# Patient Record
Sex: Female | Born: 1985 | Race: Black or African American | Hispanic: No | Marital: Single | Smoking: Former smoker
Health system: Southern US, Community
[De-identification: ages and names within clinical notes are randomized; demographics above are authoritative.]

## PROBLEM LIST (undated history)

## (undated) ENCOUNTER — Inpatient Hospital Stay (HOSPITAL_COMMUNITY): Payer: Self-pay

## (undated) DIAGNOSIS — K5900 Constipation, unspecified: Secondary | ICD-10-CM

## (undated) DIAGNOSIS — R1110 Vomiting, unspecified: Secondary | ICD-10-CM

## (undated) DIAGNOSIS — R51 Headache: Secondary | ICD-10-CM

## (undated) HISTORY — PX: INDUCED ABORTION: SHX677

## (undated) HISTORY — DX: Constipation, unspecified: K59.00

## (undated) HISTORY — DX: Vomiting, unspecified: R11.10

---
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
## 2003-09-09 ENCOUNTER — Inpatient Hospital Stay (HOSPITAL_COMMUNITY): Admission: AD | Admit: 2003-09-09 | Discharge: 2003-09-09 | Payer: Self-pay | Admitting: *Deleted

## 2003-09-25 ENCOUNTER — Inpatient Hospital Stay (HOSPITAL_COMMUNITY): Admission: AD | Admit: 2003-09-25 | Discharge: 2003-09-25 | Payer: Self-pay | Admitting: Obstetrics & Gynecology

## 2003-10-13 ENCOUNTER — Inpatient Hospital Stay (HOSPITAL_COMMUNITY): Admission: AD | Admit: 2003-10-13 | Discharge: 2003-10-13 | Payer: Self-pay | Admitting: Obstetrics and Gynecology

## 2003-11-11 ENCOUNTER — Ambulatory Visit (HOSPITAL_COMMUNITY): Admission: RE | Admit: 2003-11-11 | Discharge: 2003-11-11 | Payer: Self-pay | Admitting: *Deleted

## 2003-12-17 ENCOUNTER — Inpatient Hospital Stay (HOSPITAL_COMMUNITY): Admission: AD | Admit: 2003-12-17 | Discharge: 2003-12-17 | Payer: Self-pay | Admitting: Obstetrics and Gynecology

## 2004-01-23 ENCOUNTER — Ambulatory Visit (HOSPITAL_COMMUNITY): Admission: RE | Admit: 2004-01-23 | Discharge: 2004-01-23 | Payer: Self-pay | Admitting: *Deleted

## 2004-02-17 ENCOUNTER — Inpatient Hospital Stay (HOSPITAL_COMMUNITY): Admission: AD | Admit: 2004-02-17 | Discharge: 2004-02-17 | Payer: Self-pay | Admitting: *Deleted

## 2004-03-28 ENCOUNTER — Inpatient Hospital Stay (HOSPITAL_COMMUNITY): Admission: AD | Admit: 2004-03-28 | Discharge: 2004-03-28 | Payer: Self-pay | Admitting: *Deleted

## 2004-04-14 ENCOUNTER — Inpatient Hospital Stay (HOSPITAL_COMMUNITY): Admission: AD | Admit: 2004-04-14 | Discharge: 2004-04-18 | Payer: Self-pay | Admitting: Family Medicine

## 2004-04-15 ENCOUNTER — Encounter (INDEPENDENT_AMBULATORY_CARE_PROVIDER_SITE_OTHER): Payer: Self-pay | Admitting: Specialist

## 2004-11-11 ENCOUNTER — Emergency Department (HOSPITAL_COMMUNITY): Admission: EM | Admit: 2004-11-11 | Discharge: 2004-11-11 | Payer: Self-pay | Admitting: Emergency Medicine

## 2004-12-23 ENCOUNTER — Emergency Department (HOSPITAL_COMMUNITY): Admission: EM | Admit: 2004-12-23 | Discharge: 2004-12-23 | Payer: Self-pay | Admitting: Emergency Medicine

## 2006-01-16 ENCOUNTER — Emergency Department (HOSPITAL_COMMUNITY): Admission: EM | Admit: 2006-01-16 | Discharge: 2006-01-16 | Payer: Self-pay | Admitting: Emergency Medicine

## 2007-01-25 ENCOUNTER — Emergency Department (HOSPITAL_COMMUNITY): Admission: EM | Admit: 2007-01-25 | Discharge: 2007-01-25 | Payer: Self-pay | Admitting: Emergency Medicine

## 2010-01-07 ENCOUNTER — Emergency Department (HOSPITAL_COMMUNITY): Admission: EM | Admit: 2010-01-07 | Discharge: 2010-01-07 | Payer: Self-pay | Admitting: Emergency Medicine

## 2010-04-06 ENCOUNTER — Emergency Department (HOSPITAL_COMMUNITY): Admission: EM | Admit: 2010-04-06 | Discharge: 2010-04-06 | Payer: Self-pay | Admitting: Emergency Medicine

## 2010-04-27 ENCOUNTER — Emergency Department (HOSPITAL_COMMUNITY): Admission: EM | Admit: 2010-04-27 | Discharge: 2010-04-27 | Payer: Self-pay | Admitting: Emergency Medicine
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
## 2011-05-06 NOTE — Op Note (Signed)
NAME:  Kristen Phillips, Kristen Phillips                     ACCOUNT NO.:  [id number]   MEDICAL RECORD NO.:  [id number]                   PATIENT TYPE:  INP   LOCATION:  9103                                 FACILITY:  [facility]   PHYSICIAN:  Lesly Dukes, M.D.              DATE OF BIRTH:  [birth year]   DATE OF PROCEDURE:  04/15/2004  DATE OF DISCHARGE:                                 OPERATIVE REPORT   PREOPERATIVE DIAGNOSIS:  This is a 25 year old para 0-0-2-0 at term, with  nonreassuring fetal heart tracing, oligohydramnios, remote from delivery.   POSTOPERATIVE DIAGNOSIS:  This is a 25 year old para 0-0-2-0 at term, with  nonreassuring fetal heart tracing, oligohydramnios, remote from delivery.   PROCEDURE:  Primary low flap transverse cesarean section.   SURGEON:  Lesly Dukes, M.D.   ASSISTANTMichele Mcalpine D. Okey Dupre, M.D.   COMPLICATIONS:  None.   ANESTHESIA:  Epidural.   ESTIMATED BLOOD LOSS:  700 mL.   PATHOLOGY:  Placenta.   FINDINGS:  A viable female infant, Apgars 8 at one, 9 at five, vertex  presentation, with clear fluid.  Cord wrapped around the body x1.  Weight 4  pounds 1 ounce.  Cord pH 7.23.  Normal uterus, ovaries, and fallopian tubes.  Pediatrics at delivery.   PROCEDURE:  After informed consent was obtained, the patient was taken to  the operating room, where epidural anesthesia was found to be adequate.  The  patient was placed in the dorsal supine position.  A Foley was already in  place.  The patient was prepared and draped in normal sterile fashion.  FH  prior to skin incision was in the 40s but returned spontaneously after  approximately one month.  A dose of subcu terbutaline was given by the  anesthesiologist, and we proceeded with emergent C-section at this time.  Skin incision was made with a scalpel and carried down to the underlying  layer of fascia.  The fascia was incised in the midline and extended  bilaterally.  The superior and inferior aspects of the  fascial incision were  grasped with Kocher clamps, tented up, and dissected off sharply and bluntly  from the underlying layers of rectus muscle.  The rectus muscles were  separated in the midline.  The peritoneum was identified and tented up and  entered sharply with the Metzenbaum scissors.  This incision was extended  with good visualization of the bladder.  The bladder blade was inserted.  The vesicouterine peritoneum was identified, tented up, and entered sharply  with the Metzenbaum scissors.  The incision was extended bilaterally and the  bladder flap was created digitally.  The bladder blade was reinserted.  The  uterine incision was made in transverse fashion in the lower uterine segment  with a scalpel, extended bilaterally bluntly.  The baby's head delivered  without incident.  The nose and mouth were suctioned.  The baby's body  delivered easily.  There was  a cord wrapped around the body x1.  The cord  was clamped and cut and the baby was handed off to the awaiting  pediatrician.  Cord blood was sent for type and screen and cord gas was also  sent.  The placenta delivered spontaneously with three-vessel cord.  The  uterus was exteriorized and cleared of all clots and debris.  The uterine  incision was closed with 0 Vicryl  in a running locked fashion.  Good  hemostasis was noted.  The uterus was returned to the abdomen and noted to  be hemostatic off tension.  Irrigation was used intra-abdominally, good  hemostasis noted one last time.  The peritoneum and rectus muscles were also  hemostatic.  The fascia was closed with 0 Vicryl in a running fashion.  Good  hemostasis was noted.  The subcutaneous tissue was copiously irrigated and  noted to be hemostatic.  The skin was closed with staples.  A dressing was  placed on the abdomen.  The patient tolerated the procedure well.  The  sponge, lap, and needle count were correct x2, and the patient went to the  recovery room in stable  condition.                                               Lesly Dukes, M.D.    Lora Paula  D:  04/15/2004  T:  04/15/2004  Job:  [id number]

## 2011-06-02 ENCOUNTER — Emergency Department (HOSPITAL_COMMUNITY)
Admission: EM | Admit: 2011-06-02 | Discharge: 2011-06-03 | Disposition: A | Discharge status: Home / Self Care | Payer: Medicaid Other | Attending: Emergency Medicine | Admitting: Emergency Medicine

## 2011-06-02 DIAGNOSIS — R51 Headache: Secondary | ICD-10-CM | POA: Insufficient documentation

## 2011-06-11 ENCOUNTER — Emergency Department (HOSPITAL_COMMUNITY)
Admission: EM | Admit: 2011-06-11 | Discharge: 2011-06-11 | Disposition: A | Discharge status: Home / Self Care | Payer: Medicaid Other | Attending: Emergency Medicine | Admitting: Emergency Medicine

## 2011-06-11 ENCOUNTER — Emergency Department (HOSPITAL_COMMUNITY): Payer: Medicaid Other

## 2011-06-11 DIAGNOSIS — J069 Acute upper respiratory infection, unspecified: Secondary | ICD-10-CM | POA: Insufficient documentation

## 2011-06-11 DIAGNOSIS — R5381 Other malaise: Secondary | ICD-10-CM | POA: Insufficient documentation

## 2011-06-11 DIAGNOSIS — R110 Nausea: Secondary | ICD-10-CM | POA: Insufficient documentation

## 2011-06-11 DIAGNOSIS — R51 Headache: Secondary | ICD-10-CM | POA: Insufficient documentation

## 2011-06-11 DIAGNOSIS — B9789 Other viral agents as the cause of diseases classified elsewhere: Secondary | ICD-10-CM | POA: Insufficient documentation

## 2011-06-11 DIAGNOSIS — R5383 Other fatigue: Secondary | ICD-10-CM | POA: Insufficient documentation

## 2011-06-11 LAB — URINALYSIS, ROUTINE W REFLEX MICROSCOPIC
Bilirubin Urine: NEGATIVE
Ketones, ur: 15 mg/dL — AB
Leukocytes, UA: NEGATIVE
Nitrite: NEGATIVE
Protein, ur: NEGATIVE mg/dL
Urobilinogen, UA: 2 mg/dL — ABNORMAL HIGH (ref 0.0–1.0)

## 2011-06-11 LAB — POCT PREGNANCY, URINE: Preg Test, Ur: NEGATIVE

## 2013-07-31 ENCOUNTER — Encounter (HOSPITAL_COMMUNITY): Payer: Self-pay | Admitting: *Deleted

## 2013-07-31 ENCOUNTER — Inpatient Hospital Stay (HOSPITAL_COMMUNITY)
Admission: AD | Admit: 2013-07-31 | Discharge: 2013-07-31 | Disposition: A | Discharge status: Home / Self Care | Payer: Medicaid Other | Source: Ambulatory Visit | Attending: Obstetrics & Gynecology | Admitting: Obstetrics & Gynecology

## 2013-07-31 DIAGNOSIS — O210 Mild hyperemesis gravidarum: Secondary | ICD-10-CM | POA: Insufficient documentation

## 2013-07-31 DIAGNOSIS — R51 Headache: Secondary | ICD-10-CM | POA: Insufficient documentation

## 2013-07-31 DIAGNOSIS — O219 Vomiting of pregnancy, unspecified: Secondary | ICD-10-CM

## 2013-07-31 HISTORY — DX: Headache: R51

## 2013-07-31 LAB — URINALYSIS, ROUTINE W REFLEX MICROSCOPIC
Ketones, ur: >80 mg/dL — AB
Leukocytes, UA: NEGATIVE
Nitrite: NEGATIVE
Protein, ur: 30 mg/dL — AB
Urobilinogen, UA: 0.2 mg/dL (ref 0.0–1.0)

## 2013-07-31 LAB — POCT PREGNANCY, URINE: Preg Test, Ur: POSITIVE — AB

## 2013-07-31 MED ORDER — DEXTROSE 5 % IN LACTATED RINGERS IV BOLUS
1000.0000 mL | Freq: Once | INTRAVENOUS | Status: AC
  Administered 2013-07-31: 1000 mL via INTRAVENOUS

## 2013-07-31 MED ORDER — HYDROMORPHONE HCL PF 1 MG/ML IJ SOLN
1.0000 mg | Freq: Once | INTRAMUSCULAR | Status: AC
  Administered 2013-07-31: 1 mg via INTRAMUSCULAR
  Filled 2013-07-31: qty 1

## 2013-07-31 MED ORDER — PROMETHAZINE HCL 25 MG/ML IJ SOLN
25.0000 mg | Freq: Once | INTRAVENOUS | Status: AC
  Administered 2013-07-31: 25 mg via INTRAVENOUS
  Filled 2013-07-31: qty 1

## 2013-07-31 MED ORDER — ONDANSETRON 8 MG PO TBDP
8.0000 mg | ORAL_TABLET | Freq: Once | ORAL | Status: AC
  Administered 2013-07-31: 8 mg via ORAL
  Filled 2013-07-31: qty 1

## 2013-07-31 MED ORDER — PROMETHAZINE HCL 25 MG RE SUPP: 25.0000 mg | Freq: Four times a day (QID) | RECTAL | Status: DC | PRN

## 2013-07-31 NOTE — MAU Note (Signed)
Patient states she has had a positive pregnancy test at [facility]. Has had nausea for a while, but has not been able to eat or keep anything down for the past 2 days. Has had a headache for about 5-6 days. Denies abdominal pain or bleeding.

## 2013-07-31 NOTE — MAU Provider Note (Signed)
History     CSN: [id number]  Arrival date and time: 07/31/13 1021   First Provider Initiated Contact with Patient 07/31/13 1106      Chief Complaint  Patient presents with  . Possible Pregnancy  . Emesis   HPI Ms. Cellie A Manrique is a 27 y.o. [id number] at [redacted]w[redacted]d who presents to MAU today with complaint of N/V and headache. [facility] She has been having N/V since then. States that she hasn't been able to keep anything down x 3 days. She has headache today rated at 10/10. She has not taken anything for pain. She denies abdominal pain, vaginal bleeding, discharge, UTI symptoms, fever, diarrhea or constipation.   OB History   Grav Para Term Preterm Abortions TAB SAB Ect Mult Living   5 1 1  3 3    1       Past Medical History  Diagnosis Date  . ZHYQMVHQ(469.6)     Past Surgical History  Procedure Laterality Date  . Cesarean section    . Induced abortion      Family History  Problem Relation Age of Onset  . Diabetes Mother   . Diabetes Maternal Grandmother   . Diabetes Paternal Grandmother     History  Substance Use Topics  . Smoking status: Never Smoker   . Smokeless tobacco: Not on file  . Alcohol Use: No    Allergies: Allergies not on file  No prescriptions prior to admission    Review of Systems  Constitutional: Negative for fever and malaise/fatigue.  Gastrointestinal: Positive for nausea and vomiting. Negative for abdominal pain, diarrhea and constipation.  Genitourinary: Negative for dysuria, urgency and frequency.       Neg - vaginal bleeding, discharge  Neurological: Positive for dizziness and weakness. Negative for loss of consciousness.   Physical Exam   Blood pressure 117/78, pulse 92, temperature 98.9 F (37.2 C), temperature source Oral, resp. rate 18, height 5\' 2"  (1.575 m), weight 139 lb 6.4 oz (63.231 kg), last menstrual period 06/15/2013, SpO2 100.00%.  Physical Exam  Constitutional: She is  oriented to person, place, and time. She appears well-developed and well-nourished. No distress.  HENT:  Head: Normocephalic and atraumatic.  Cardiovascular: Normal rate and regular rhythm.   Respiratory: Effort normal and breath sounds normal. No respiratory distress.  GI: Soft. Bowel sounds are normal. She exhibits no distension and no mass. There is no tenderness. There is no rebound and no guarding.  Neurological: She is alert and oriented to person, place, and time.  Skin: Skin is warm and dry. No erythema.  Psychiatric: She has a normal mood and affect.   Results for orders placed during the hospital encounter of 07/31/13 (from the past 24 hour(s))  POCT PREGNANCY, URINE     Status: Abnormal   Collection Time    07/31/13 11:04 AM      Result Value Range   Preg Test, Ur POSITIVE (*) NEGATIVE     MAU Course  Procedures None  MDM UPT - positive UA today 1 liter Phenergan infused IV LR given 1 mg Dilaudid for headache 1240 - Patient reports significant improvement in symptoms. Will try PO intake and 1 additional liter of D5LR for adequate hydration 1320 - Care turned over to Jeani Sow, NP  Freddi Starr, PA-C  07/31/2013, 11:06 AM  Assessment and Plan  MDM continued: 13.35  Patient is feeling better.  Headache has resolved.  She is ready to  try crackers and sprite.  Patient was unable to keep sprite and crackers down.  Zofran 8mg  ODT given 14:51  Patient is ready for discharge.  Discussed antiemetics with the patient and would like Rx for Phenergan Supp.   She is driving and states she feels fine to drive.  Denies sleepiness/drowsiness from meds given in MAU.     A:  Nausea and vomiting at [redacted]w[redacted]d gestation  P:  Rx for Phenergan Supp to pharmacy      Diet instructions given      Patient has list of MDs to begin prenatal care

## 2013-07-31 NOTE — MAU Note (Signed)
C/o N&V for a week-- last 2 days have been the worse;

## 2013-08-01 LAB — URINE CULTURE: Culture: NO GROWTH

## 2013-08-01 NOTE — MAU Provider Note (Signed)

## 2013-08-10 ENCOUNTER — Inpatient Hospital Stay (HOSPITAL_COMMUNITY)
Admission: AD | Admit: 2013-08-10 | Discharge: 2013-08-10 | Disposition: A | Discharge status: Home / Self Care | Payer: Medicaid Other | Source: Ambulatory Visit | Attending: Obstetrics & Gynecology | Admitting: Obstetrics & Gynecology

## 2013-08-10 ENCOUNTER — Encounter (HOSPITAL_COMMUNITY): Payer: Self-pay | Admitting: *Deleted

## 2013-08-10 DIAGNOSIS — O219 Vomiting of pregnancy, unspecified: Secondary | ICD-10-CM

## 2013-08-10 DIAGNOSIS — O210 Mild hyperemesis gravidarum: Secondary | ICD-10-CM | POA: Insufficient documentation

## 2013-08-10 LAB — URINALYSIS, ROUTINE W REFLEX MICROSCOPIC
Glucose, UA: NEGATIVE mg/dL
Leukocytes, UA: NEGATIVE
Urobilinogen, UA: 1 mg/dL (ref 0.0–1.0)
pH: 7 (ref 5.0–8.0)

## 2013-08-10 LAB — URINE MICROSCOPIC-ADD ON

## 2013-08-10 MED ORDER — PROMETHAZINE HCL 25 MG RE SUPP: 25.0000 mg | Freq: Four times a day (QID) | RECTAL | Status: DC | PRN

## 2013-08-10 MED ORDER — ONDANSETRON 8 MG/NS 50 ML IVPB
8.0000 mg | Freq: Once | INTRAVENOUS | Status: AC
  Administered 2013-08-10: 8 mg via INTRAVENOUS
  Filled 2013-08-10: qty 8

## 2013-08-10 MED ORDER — SODIUM CHLORIDE 0.9 % IV SOLN
Freq: Once | INTRAVENOUS | Status: AC
  Administered 2013-08-10: 18:00:00 via INTRAVENOUS
  Filled 2013-08-10: qty 1000

## 2013-08-10 NOTE — MAU Provider Note (Signed)
  CSN: [id number]     Arrival date & time 08/10/13  1654 History     None    Chief Complaint  Patient presents with  . Emesis During Pregnancy   (Consider location/radiation/quality/duration/timing/severity/associated sxs/prior Treatment) HPI Kristen Phillips is a 27 y.o. G5P1031 at [redacted]w[redacted]d. She presents for re occurrence of nausea and vomiting. She ran out of her Phenergan supp, the had stopped working about 2 d previous to that. She is vomiting every hr, hasn't kept down a meal in 1-2 days. No bleeding or cramping. Has applied for Medicaid.  Past Medical History  Diagnosis Date  . [id number](478.2)    Past Surgical History  Procedure Laterality Date  . Cesarean section    . Induced abortion     Family History  Problem Relation Age of Onset  . Diabetes Mother   . Diabetes Maternal Grandmother   . Diabetes Paternal Grandmother    History  Substance Use Topics  . Smoking status: Never Smoker   . Smokeless tobacco: Not on file  . Alcohol Use: No   OB History   Grav Para Term Preterm Abortions TAB SAB Ect Mult Living   5 1 1  3 3    1      Review of Systems  Constitutional: Positive for appetite change and fatigue. Negative for fever and chills.  Gastrointestinal: Positive for nausea and vomiting. Negative for abdominal pain, diarrhea and constipation.  Genitourinary: Negative for dysuria, urgency, frequency, vaginal bleeding and vaginal discharge.    Allergies  Review of patient's allergies indicates no known allergies.  Home Medications  No current outpatient prescriptions on file. BP 114/79  Pulse 100  Temp(Src) 98.6 F (37 C) (Oral)  Resp 18  Ht 5' 1.5" (1.562 m)  Wt 141 lb (63.957 kg)  BMI 26.21 kg/m2  LMP 06/15/2013 Physical Exam  Constitutional: She is oriented to person, place, and time. She appears well-developed and well-nourished.  Abdominal: Soft. There is no tenderness.  Musculoskeletal: Normal range of motion.  Neurological: She is alert and  oriented to person, place, and time.  Skin: Skin is warm and dry.  Psychiatric: She has a normal mood and affect. Her behavior is normal.    ED Course   Procedures (including critical care time)  Labs Reviewed  URINALYSIS, ROUTINE W REFLEX MICROSCOPIC - Abnormal; Notable for the following:    Ketones, ur 40 (*)    Protein, ur 30 (*)    All other components within normal limits  URINE MICROSCOPIC-ADD ON - Abnormal; Notable for the following:    Squamous Epithelial / LPF FEW (*)    All other components within normal limits      MDM  ASSESSMENT: 8 wk with nausea and vomiting in pregnancy Kept crackers and fluid down after Zofran and 1 liter fluids Rx Pheneran 25 mg supp for home use Behaviors reviewed for N&V management Make an appt for [facility] when gets benefits

## 2013-08-10 NOTE — MAU Note (Signed)
Can't stop vomiting, the medicine they gave her is not working, phenergan supp, last used this morning.

## 2013-08-10 NOTE — MAU Note (Signed)
Pt reports n/v for 2-3 days. Pt reports the her medicine was working up until a couple of days ago and now she can not keep any food down.

## 2013-08-12 ENCOUNTER — Encounter (HOSPITAL_COMMUNITY): Payer: Self-pay | Admitting: *Deleted

## 2013-08-12 ENCOUNTER — Inpatient Hospital Stay (HOSPITAL_COMMUNITY)
Admission: AD | Admit: 2013-08-12 | Discharge: 2013-08-12 | Disposition: A | Discharge status: Home / Self Care | Payer: Medicaid Other | Source: Ambulatory Visit | Attending: Obstetrics & Gynecology | Admitting: Obstetrics & Gynecology

## 2013-08-12 DIAGNOSIS — O219 Vomiting of pregnancy, unspecified: Secondary | ICD-10-CM

## 2013-08-12 DIAGNOSIS — O210 Mild hyperemesis gravidarum: Secondary | ICD-10-CM

## 2013-08-12 LAB — URINALYSIS, ROUTINE W REFLEX MICROSCOPIC
Ketones, ur: >80 mg/dL — AB
Leukocytes, UA: NEGATIVE
Nitrite: NEGATIVE
Specific Gravity, Urine: >1.03 — ABNORMAL HIGH (ref 1.005–1.030)
Urobilinogen, UA: 1 mg/dL (ref 0.0–1.0)
pH: 6.5 (ref 5.0–8.0)

## 2013-08-12 LAB — COMPREHENSIVE METABOLIC PANEL
ALT: 55 U/L — ABNORMAL HIGH (ref 0–35)
AST: 29 U/L (ref 0–37)
Albumin: 3.9 g/dL (ref 3.5–5.2)
BUN: 8 mg/dL (ref 6–23)
CO2: 17 mEq/L — ABNORMAL LOW (ref 19–32)
Calcium: 9.4 mg/dL (ref 8.4–10.5)
GFR calc non Af Amer: >90 mL/min (ref >90)
Sodium: 136 mEq/L (ref 135–145)

## 2013-08-12 MED ORDER — RANITIDINE HCL 150 MG PO TABS: 150.0000 mg | ORAL_TABLET | Freq: Two times a day (BID) | ORAL | Status: DC

## 2013-08-12 MED ORDER — METOCLOPRAMIDE HCL 10 MG PO TABS
10.0000 mg | ORAL_TABLET | Freq: Once | ORAL | Status: AC
  Administered 2013-08-12: 10 mg via ORAL
  Filled 2013-08-12: qty 1

## 2013-08-12 MED ORDER — ONDANSETRON 8 MG/NS 50 ML IVPB
8.0000 mg | Freq: Once | INTRAVENOUS | Status: AC
  Administered 2013-08-12: 8 mg via INTRAVENOUS
  Filled 2013-08-12: qty 8

## 2013-08-12 MED ORDER — DEXTROSE 5 % IN LACTATED RINGERS IV BOLUS
1000.0000 mL | Freq: Once | INTRAVENOUS | Status: AC
  Administered 2013-08-12: 1000 mL via INTRAVENOUS

## 2013-08-12 MED ORDER — METOCLOPRAMIDE HCL 10 MG PO TABS: 10.0000 mg | ORAL_TABLET | Freq: Four times a day (QID) | ORAL | Status: DC

## 2013-08-12 MED ORDER — LACTATED RINGERS IV BOLUS (SEPSIS)
1000.0000 mL | Freq: Once | INTRAVENOUS | Status: AC
  Administered 2013-08-12: 1000 mL via INTRAVENOUS

## 2013-08-12 NOTE — MAU Note (Signed)
Pt states here for vomiting. Has vomited x10 today. Is constantly nauseated. Last attempted food on Saturday. Denies abnormal vaginal discharge or bleeding. No pain at present.

## 2013-08-12 NOTE — MAU Provider Note (Signed)
Attestation of Attending Supervision of Advanced Practitioner (CNM/NP): Evaluation and management procedures were performed by the Advanced Practitioner under my supervision and collaboration.  I have reviewed the Advanced Practitioner's note and chart, and I agree with the management and plan.  HARRAWAY-SMITH, Abdoulaye Drum 10:28 PM     

## 2013-08-12 NOTE — MAU Provider Note (Signed)
History     CSN: [id number]  Arrival date and time: 08/12/13 5284   First Provider Initiated Contact with Patient 08/12/13 1900      Chief Complaint  Patient presents with  . Hyperemesis Gravidarum   HPI  Kristen Phillips is a 27 y.o. G5P1031 at [redacted]w[redacted]d who presents today with nausea and vomiting. This is her 3rd visit for this complaint. She was most recently here on 08/10/13, and has lost 3lbs since that visit. She has been using phenergan suppositories at home, and they are not helping. She states that it helped for about a week. She has no other medicines at home. She has not started [facility] and does not have an appointment at this time.   Past Medical History  Diagnosis Date  . XLKGMWNU(272.5)     Past Surgical History  Procedure Laterality Date  . Cesarean section    . Induced abortion      Family History  Problem Relation Age of Onset  . Diabetes Mother   . Diabetes Maternal Grandmother   . Diabetes Paternal Grandmother     History  Substance Use Topics  . Smoking status: Former Smoker -- 0.25 packs/day for 12 years    Types: Cigarettes    Quit date: 07/13/2013  . Smokeless tobacco: Not on file  . Alcohol Use: No    Allergies: No Known Allergies  Prescriptions prior to admission  Medication Sig Dispense Refill  . promethazine (PHENERGAN) 25 MG suppository Place 1 suppository (25 mg total) rectally every 6 (six) hours as needed for nausea.  12 each  0  . promethazine (PROMETHEGAN) 25 MG suppository Place 1 suppository (25 mg total) rectally every 6 (six) hours as needed for nausea.  30 each  0    ROS Physical Exam   Blood pressure 126/81, pulse 86, temperature 100 F (37.8 C), temperature source Oral, resp. rate 16, height 5\' 2"  (1.575 m), weight 63.05 kg (139 lb), last menstrual period 06/15/2013, SpO2 100.00%.  Physical Exam  Nursing note and vitals reviewed. Constitutional: She is oriented to person, place, and time. She appears well-developed and  well-nourished. No distress.  Cardiovascular: Normal rate.   Respiratory: Effort normal.  GI: Soft.  Vomiting while in room.   Neurological: She is alert and oriented to person, place, and time.  Skin: Skin is warm and dry.  Psychiatric: She has a normal mood and affect.    MAU Course  Procedures  Results for orders placed during the hospital encounter of 08/12/13 (from the past 24 hour(s))  URINALYSIS, ROUTINE W REFLEX MICROSCOPIC     Status: Abnormal   Collection Time    08/12/13  6:27 PM      Result Value Range   Color, Urine AMBER (*) YELLOW   APPearance HAZY (*) CLEAR   Specific Gravity, Urine >1.030 (*) 1.005 - 1.030   pH 6.5  5.0 - 8.0   Glucose, UA NEGATIVE  NEGATIVE mg/dL   Hgb urine dipstick NEGATIVE  NEGATIVE   Bilirubin Urine SMALL (*) NEGATIVE   Ketones, ur >80 (*) NEGATIVE mg/dL   Protein, ur NEGATIVE  NEGATIVE mg/dL   Urobilinogen, UA 1.0  0.0 - 1.0 mg/dL   Nitrite NEGATIVE  NEGATIVE   Leukocytes, UA NEGATIVE  NEGATIVE  COMPREHENSIVE METABOLIC PANEL     Status: Abnormal   Collection Time    08/12/13  7:05 PM      Result Value Range   Sodium 136  135 - 145 mEq/L  Potassium 3.5  3.5 - 5.1 mEq/L   Chloride 102  96 - 112 mEq/L   CO2 17 (*) 19 - 32 mEq/L   Glucose, Bld 117 (*) 70 - 99 mg/dL   BUN 8  6 - 23 mg/dL   Creatinine, Ser 1.61  0.50 - 1.10 mg/dL   Calcium 9.4  8.4 - 09.6 mg/dL   Total Protein 6.9  6.0 - 8.3 g/dL   Albumin 3.9  3.5 - 5.2 g/dL   AST 29  0 - 37 U/L   ALT 55 (*) 0 - 35 U/L   Alkaline Phosphatase 67  39 - 117 U/L   Total Bilirubin 0.7  0.3 - 1.2 mg/dL   GFR calc non Af Amer >90  >90 mL/min   GFR calc Af Amer >90  >90 mL/min    2145: Patient is feeling much better at this time.   Assessment and Plan   1. Nausea and vomiting in pregnancy prior to [redacted] weeks gestation    Start [facility] as soon as possible 1st trimester precautions RX: reglan 10mg  QID Zantac 150mg  BID Vitamin B6 100mg  QD Phenergan PRN  Return to MAU as needed    Tawnya Crook 08/12/2013, 7:01 PM

## 2013-08-12 NOTE — MAU Note (Signed)
Pt been vomiting for the last 3 weeks. Given a phenergan suppository which helped for a couple of hours.

## 2013-08-27 ENCOUNTER — Encounter (HOSPITAL_COMMUNITY): Payer: Self-pay | Admitting: *Deleted

## 2013-08-27 ENCOUNTER — Inpatient Hospital Stay (HOSPITAL_COMMUNITY)
Admission: AD | Admit: 2013-08-27 | Discharge: 2013-08-27 | Disposition: A | Discharge status: Home / Self Care | Payer: Medicaid Other | Source: Ambulatory Visit | Attending: Obstetrics and Gynecology | Admitting: Obstetrics and Gynecology

## 2013-08-27 DIAGNOSIS — O219 Vomiting of pregnancy, unspecified: Secondary | ICD-10-CM

## 2013-08-27 DIAGNOSIS — O210 Mild hyperemesis gravidarum: Secondary | ICD-10-CM | POA: Insufficient documentation

## 2013-08-27 LAB — URINALYSIS, ROUTINE W REFLEX MICROSCOPIC
Leukocytes, UA: NEGATIVE
Specific Gravity, Urine: >1.03 — ABNORMAL HIGH (ref 1.005–1.030)
Urobilinogen, UA: >8 mg/dL — ABNORMAL HIGH (ref 0.0–1.0)
pH: 6 (ref 5.0–8.0)

## 2013-08-27 LAB — URINE MICROSCOPIC-ADD ON

## 2013-08-27 LAB — COMPREHENSIVE METABOLIC PANEL
ALT: 33 U/L (ref 0–35)
Albumin: 4.2 g/dL (ref 3.5–5.2)
Alkaline Phosphatase: 71 U/L (ref 39–117)
BUN: 13 mg/dL (ref 6–23)
Chloride: 101 mEq/L (ref 96–112)
GFR calc Af Amer: >90 mL/min (ref >90)
Glucose, Bld: 91 mg/dL (ref 70–99)
Potassium: 3.9 mEq/L (ref 3.5–5.1)
Sodium: 135 mEq/L (ref 135–145)
Total Bilirubin: 1.3 mg/dL — ABNORMAL HIGH (ref 0.3–1.2)
Total Protein: 7.8 g/dL (ref 6.0–8.3)

## 2013-08-27 MED ORDER — ONDANSETRON HCL 4 MG PO TABS: 4.0000 mg | ORAL_TABLET | Freq: Four times a day (QID) | ORAL | Status: DC

## 2013-08-27 MED ORDER — ONDANSETRON 8 MG/NS 50 ML IVPB
8.0000 mg | Freq: Once | INTRAVENOUS | Status: AC
  Administered 2013-08-27: 8 mg via INTRAVENOUS
  Filled 2013-08-27: qty 8

## 2013-08-27 MED ORDER — PROMETHAZINE HCL 25 MG RE SUPP: 25.0000 mg | Freq: Four times a day (QID) | RECTAL | Status: DC | PRN

## 2013-08-27 MED ORDER — PROMETHAZINE HCL 25 MG/ML IJ SOLN
25.0000 mg | Freq: Once | INTRAVENOUS | Status: AC
  Administered 2013-08-27: 25 mg via INTRAVENOUS
  Filled 2013-08-27: qty 1

## 2013-08-27 MED ORDER — LACTATED RINGERS IV SOLN
Freq: Once | INTRAVENOUS | Status: AC
  Administered 2013-08-27: 14:00:00 via INTRAVENOUS

## 2013-08-27 MED ORDER — FAMOTIDINE IN NACL 20-0.9 MG/50ML-% IV SOLN
20.0000 mg | Freq: Once | INTRAVENOUS | Status: AC
  Administered 2013-08-27: 20 mg via INTRAVENOUS
  Filled 2013-08-27: qty 50

## 2013-08-27 NOTE — MAU Provider Note (Signed)
History     CSN: [id number]  Arrival date and time: 08/27/13 1252   First Provider Initiated Contact with Patient 08/27/13 1346      Chief Complaint  Patient presents with  . Emesis During Pregnancy   HPI  [name] is a 27 y.o. female; [phone] at [redacted]w[redacted]d who presents with Nausea/Vomiting. She is unable to keep anything down; she has had nothing to eat or drink today. She is unsure where she plans to go for prenatal care; she is still waiting on her medicaid.  She has phenergan suppository's at home; yesterday she took two doses and attempted to eat toast and crackers in which she was unable to keep down. For 3 days she was feeling better and stopped taking her medication, however the symptoms have returned. She has lost 4 lbs since she became pregnant.   OB History   Grav Para Term Preterm Abortions TAB SAB Ect Mult Living   5 1 1  3 3    1       Past Medical History  Diagnosis Date  . ZHYQMVHQ(469.6)     Past Surgical History  Procedure Laterality Date  . Cesarean section    . Induced abortion      Family History  Problem Relation Age of Onset  . Diabetes Mother   . Diabetes Maternal Grandmother   . Diabetes Paternal Grandmother     History  Substance Use Topics  . Smoking status: Former Smoker -- 0.25 packs/day for 12 years    Types: Cigarettes    Quit date: 07/13/2013  . Smokeless tobacco: Not on file  . Alcohol Use: No    Allergies: No Known Allergies  Prescriptions prior to admission  Medication Sig Dispense Refill  . metoCLOPramide (REGLAN) 10 MG tablet Take 1 tablet (10 mg total) by mouth 4 (four) times daily.  180 tablet  3  . promethazine (PHENERGAN) 25 MG suppository Place 1 suppository (25 mg total) rectally every 6 (six) hours as needed for nausea.  12 each  0  . Pyridoxine HCl (VITAMIN B-6 PO) Take 1 tablet by mouth daily.      . ranitidine (ZANTAC) 150 MG tablet Take 1 tablet (150 mg total) by mouth 2 (two) times daily.  60 tablet  3    Results for orders placed during the hospital encounter of 08/27/13 (from the past 24 hour(s))  URINALYSIS, ROUTINE W REFLEX MICROSCOPIC     Status: Abnormal   Collection Time    08/27/13  1:10 PM      Result Value Range   Color, Urine AMBER (*) YELLOW   APPearance CLOUDY (*) CLEAR   Specific Gravity, Urine >1.030 (*) 1.005 - 1.030   pH 6.0  5.0 - 8.0   Glucose, UA 100 (*) NEGATIVE mg/dL   Hgb urine dipstick NEGATIVE  NEGATIVE   Bilirubin Urine MODERATE (*) NEGATIVE   Ketones, ur >80 (*) NEGATIVE mg/dL   Protein, ur 30 (*) NEGATIVE mg/dL   Urobilinogen, UA >2.9 (*) 0.0 - 1.0 mg/dL   Nitrite NEGATIVE  NEGATIVE   Leukocytes, UA NEGATIVE  NEGATIVE  URINE MICROSCOPIC-ADD ON     Status: Abnormal   Collection Time    08/27/13  1:10 PM      Result Value Range   Squamous Epithelial / LPF MANY (*) RARE   WBC, UA 3-6  <3 WBC/hpf   RBC / HPF 3-6  <3 RBC/hpf   Bacteria, UA MANY (*) RARE   Urine-Other MUCOUS  PRESENT    COMPREHENSIVE METABOLIC PANEL     Status: Abnormal   Collection Time    08/27/13  2:04 PM      Result Value Range   Sodium 135  135 - 145 mEq/L   Potassium 3.9  3.5 - 5.1 mEq/L   Chloride 101  96 - 112 mEq/L   CO2 19  19 - 32 mEq/L   Glucose, Bld 91  70 - 99 mg/dL   BUN 13  6 - 23 mg/dL   Creatinine, Ser 1.61  0.50 - 1.10 mg/dL   Calcium 09.6  8.4 - 04.5 mg/dL   Total Protein 7.8  6.0 - 8.3 g/dL   Albumin 4.2  3.5 - 5.2 g/dL   AST 32  0 - 37 U/L   ALT 33  0 - 35 U/L   Alkaline Phosphatase 71  39 - 117 U/L   Total Bilirubin 1.3 (*) 0.3 - 1.2 mg/dL   GFR calc non Af Amer >90  >90 mL/min   GFR calc Af Amer >90  >90 mL/min    Review of Systems  Constitutional: Negative for fever and chills.  Gastrointestinal: Positive for nausea and vomiting. Negative for abdominal pain, diarrhea and constipation.       Vomiting 10-15 times per day  Genitourinary: Negative for dysuria, urgency, frequency and hematuria.       No vaginal discharge. No vaginal bleeding. No  dysuria.   Neurological: Positive for dizziness and headaches.   Physical Exam   Blood pressure 119/80, temperature 98.3 F (36.8 C), temperature source Oral, resp. rate 16, weight 61.145 kg (134 lb 12.8 oz), last menstrual period 06/15/2013, SpO2 100.00%. Fetal heart tones by doppler 157 bpm    Physical Exam  Constitutional: She is oriented to person, place, and time. She appears well-developed and well-nourished. No distress.  HENT:  Head: Normocephalic.  Eyes: Pupils are equal, round, and reactive to light.  Neck: Neck supple.  Cardiovascular: Normal rate.   Respiratory: Effort normal.  GI: Soft. There is no tenderness.  Neurological: She is alert and oriented to person, place, and time.  Skin: Skin is warm and dry. She is not diaphoretic.  Psychiatric: She exhibits a depressed mood.    MAU Course  Procedures  MDM UA CBC +fht Phenergan bolus 1 L LR bolus 1 L  Weight on 08/12/13: 139 lbs Weight on 08/27/13: 134 lbs  Patient has a HA 6/10 Vomited after trial of oral fluids Zofran 8 mg IV piggy back  Pepcid 20 mg IVP  Pt tolerate PO fluids; refused to attempt PO solids/crackers Pt talking on the cell phone; awake and alert at discharge   Assessment and Plan  A: Nausea and vomiting in pregnancy   P: Discharge home RX: Phenergan suppository         Zofran 4 mg PO Q8 hours PRN  Return to MAU if symptoms worsen Take your medication; wait 30 minutes and eat something bland  BRAT diet discussed Referral to the clinic made   [facility], Quiana Cobaugh IRENE FNP-C 08/27/2013, 8:32 PM

## 2013-08-27 NOTE — MAU Note (Addendum)
Patient c/o of nausea and vomiting since Friday. Patient reports she vomited 10 times in the past 24 hours. Patient reports not being able to tolerate fluids or food since yesterday. Patient took anti-emetic yesterday but it did not work. Patient denies vaginal bleeding and lof. Patient also reports some dizziness.

## 2013-08-27 NOTE — MAU Note (Signed)
Patient states she has not been able to keep anything down since 9-5 and has had headache for 2 days. Denies bleeding or discharge.

## 2013-08-28 LAB — URINE CULTURE: Colony Count: 60000

## 2013-08-28 NOTE — MAU Provider Note (Signed)
Attestation of Attending Supervision of Advanced Practitioner (CNM/NP): Evaluation and management procedures were performed by the Advanced Practitioner under my supervision and collaboration.  I have reviewed the Advanced Practitioner's note and chart, and I agree with the management and plan.  Ashyla Luth 08/28/2013 8:43 AM

## 2013-09-07 ENCOUNTER — Inpatient Hospital Stay (HOSPITAL_COMMUNITY)
Admission: AD | Admit: 2013-09-07 | Discharge: 2013-09-07 | Disposition: A | Discharge status: Home / Self Care | Payer: Medicaid Other | Source: Ambulatory Visit | Attending: Obstetrics & Gynecology | Admitting: Obstetrics & Gynecology

## 2013-09-07 ENCOUNTER — Encounter (HOSPITAL_COMMUNITY): Payer: Self-pay | Admitting: *Deleted

## 2013-09-07 DIAGNOSIS — O210 Mild hyperemesis gravidarum: Secondary | ICD-10-CM | POA: Insufficient documentation

## 2013-09-07 LAB — CBC WITH DIFFERENTIAL/PLATELET
Basophils Relative: 0 % (ref 0–1)
Eosinophils Absolute: 0.1 10*3/uL (ref 0.0–0.7)
Eosinophils Relative: 1 % (ref 0–5)
HCT: 37.1 % (ref 36.0–46.0)
Hemoglobin: 12.7 g/dL (ref 12.0–15.0)
MCH: 32.3 pg (ref 26.0–34.0)
MCHC: 34.2 g/dL (ref 30.0–36.0)
MCV: 94.4 fL (ref 78.0–100.0)
Monocytes Absolute: 0.8 10*3/uL (ref 0.1–1.0)
Monocytes Relative: 7 % (ref 3–12)
RDW: 12.6 % (ref 11.5–15.5)

## 2013-09-07 LAB — URINE MICROSCOPIC-ADD ON: RBC / HPF: NONE SEEN RBC/hpf (ref <3)

## 2013-09-07 LAB — COMPREHENSIVE METABOLIC PANEL
Albumin: 3.7 g/dL (ref 3.5–5.2)
BUN: 8 mg/dL (ref 6–23)
Calcium: 9.7 mg/dL (ref 8.4–10.5)
Chloride: 102 mEq/L (ref 96–112)
Creatinine, Ser: 0.7 mg/dL (ref 0.50–1.10)
Total Bilirubin: 0.5 mg/dL (ref 0.3–1.2)
Total Protein: 6.7 g/dL (ref 6.0–8.3)

## 2013-09-07 LAB — URINALYSIS, ROUTINE W REFLEX MICROSCOPIC
Glucose, UA: NEGATIVE mg/dL
Ketones, ur: >80 mg/dL — AB
Protein, ur: 30 mg/dL — AB
pH: 6 (ref 5.0–8.0)

## 2013-09-07 MED ORDER — ONDANSETRON HCL 4 MG/2ML IJ SOLN
4.0000 mg | Freq: Once | INTRAMUSCULAR | Status: AC
  Administered 2013-09-07: 4 mg via INTRAVENOUS
  Filled 2013-09-07: qty 2

## 2013-09-07 MED ORDER — DEXTROSE IN LACTATED RINGERS 5 % IV SOLN
INTRAVENOUS | Status: DC
  Administered 2013-09-07: 19:00:00 via INTRAVENOUS

## 2013-09-07 MED ORDER — PROMETHAZINE HCL 25 MG/ML IJ SOLN
25.0000 mg | Freq: Once | INTRAVENOUS | Status: AC
  Administered 2013-09-07: 25 mg via INTRAVENOUS
  Filled 2013-09-07: qty 1

## 2013-09-07 NOTE — MAU Provider Note (Signed)
History     CSN: [id number]  Arrival date and time: 09/07/13 1554   None     Chief Complaint  Patient presents with  . Emesis During Pregnancy    HPI Kristen Phillips is 27 y.o. [id number] [redacted]w[redacted]d weeks presenting with nausea and vomiting in first trimester pregnancy.  This is her 5th visit to MAU for same complaint.  She has used Phenergan Suppositories and Zofran at home.  She said the Phenergan stopped working and she hasn't picked up Rx for Zofran that is at the pharmacy.  She has vomited X 6-7 times today.  Denies vaginal bleeding or discharge.     Past Medical History  Diagnosis Date  . NWGNFAOZ(308.6)     Past Surgical History  Procedure Laterality Date  . Cesarean section    . Induced abortion      Family History  Problem Relation Age of Onset  . Diabetes Mother   . Diabetes Maternal Grandmother   . Diabetes Paternal Grandmother     History  Substance Use Topics  . Smoking status: Former Smoker -- 0.25 packs/day for 12 years    Types: Cigarettes    Quit date: 07/13/2013  . Smokeless tobacco: Not on file  . Alcohol Use: No    Allergies: No Known Allergies  Prescriptions prior to admission  Medication Sig Dispense Refill  . metoCLOPramide (REGLAN) 10 MG tablet Take 1 tablet (10 mg total) by mouth 4 (four) times daily.  180 tablet  3  . ondansetron (ZOFRAN) 4 MG tablet Take 1 tablet (4 mg total) by mouth every 6 (six) hours.  12 tablet  0  . promethazine (PHENERGAN) 25 MG suppository Place 1 suppository (25 mg total) rectally every 6 (six) hours as needed for nausea.  12 each  0  . promethazine (PHENERGAN) 25 MG suppository Place 1 suppository (25 mg total) rectally every 6 (six) hours as needed for nausea.  12 each  0  . Pyridoxine HCl (VITAMIN B-6 PO) Take 1 tablet by mouth daily.      . ranitidine (ZANTAC) 150 MG tablet Take 1 tablet (150 mg total) by mouth 2 (two) times daily.  60 tablet  3    Review of Systems  Constitutional: Negative for fever,  chills and weight loss (6 lb weight gain since visit here 8/13.).  Gastrointestinal: Positive for nausea, vomiting and abdominal pain (when vomiting).  Genitourinary: Negative for dysuria, urgency, frequency and hematuria.       Negative for vaginal bleeding or discharge   Physical Exam   Blood pressure 111/71, pulse 104, temperature 99 F (37.2 C), resp. rate 16, height 5\' 3"  (1.6 m), weight 140 lb (63.504 kg), last menstrual period 06/15/2013.  Physical Exam  Vitals reviewed. Constitutional: She is oriented to person, place, and time. She appears well-developed and well-nourished. No distress.  HENT:  Head: Normocephalic.  Respiratory: Effort normal.  GI: Soft. She exhibits no distension. There is no tenderness. There is no rebound and no guarding.  Genitourinary:  Not indicated  Neurological: She is alert and oriented to person, place, and time.  Skin: Skin is warm and dry.  Psychiatric: She has a normal mood and affect. Her behavior is normal.   Results for orders placed during the hospital encounter of 09/07/13 (from the past 24 hour(s))  URINALYSIS, ROUTINE W REFLEX MICROSCOPIC     Status: Abnormal   Collection Time    09/07/13  4:00 PM      Result Value Range  Color, Urine YELLOW  YELLOW   APPearance HAZY (*) CLEAR   Specific Gravity, Urine >1.030 (*) 1.005 - 1.030   pH 6.0  5.0 - 8.0   Glucose, UA NEGATIVE  NEGATIVE mg/dL   Hgb urine dipstick TRACE (*) NEGATIVE   Bilirubin Urine SMALL (*) NEGATIVE   Ketones, ur >80 (*) NEGATIVE mg/dL   Protein, ur 30 (*) NEGATIVE mg/dL   Urobilinogen, UA 1.0  0.0 - 1.0 mg/dL   Nitrite NEGATIVE  NEGATIVE   Leukocytes, UA NEGATIVE  NEGATIVE  URINE MICROSCOPIC-ADD ON     Status: Abnormal   Collection Time    09/07/13  4:00 PM      Result Value Range   Squamous Epithelial / LPF MANY (*) RARE   WBC, UA 3-6  <3 WBC/hpf   RBC / HPF    <3 RBC/hpf   Value: NO FORMED ELEMENTS SEEN ON URINE MICROSCOPIC EXAMINATION   Urine-Other MUCOUS  PRESENT    CBC WITH DIFFERENTIAL     Status: Abnormal   Collection Time    09/07/13  4:47 PM      Result Value Range   WBC 11.7 (*) 4.0 - 10.5 K/uL   RBC 3.93  3.87 - 5.11 MIL/uL   Hemoglobin 12.7  12.0 - 15.0 g/dL   HCT 98.1  19.1 - 47.8 %   MCV 94.4  78.0 - 100.0 fL   MCH 32.3  26.0 - 34.0 pg   MCHC 34.2  30.0 - 36.0 g/dL   RDW 29.5  62.1 - 30.8 %   Platelets 294  150 - 400 K/uL   Neutrophils Relative % 72  43 - 77 %   Neutro Abs 8.5 (*) 1.7 - 7.7 K/uL   Lymphocytes Relative 20  12 - 46 %   Lymphs Abs 2.4  0.7 - 4.0 K/uL   Monocytes Relative 7  3 - 12 %   Monocytes Absolute 0.8  0.1 - 1.0 K/uL   Eosinophils Relative 1  0 - 5 %   Eosinophils Absolute 0.1  0.0 - 0.7 K/uL   Basophils Relative 0  0 - 1 %   Basophils Absolute 0.0  0.0 - 0.1 K/uL    MAU Course  Procedures  MDM NOTE:  Patient has gained 6 lbs since her first visit here.  IV hydration--1 liter of D5LR with Phenergan 25mg   Care turned over to J. Ethier, PA at 17:00  1700 - care assumed from Jeani Sow, NP First liter of phenergan infused D5 LR complete. Patient has not had emesis x ~ 30 minutes, but continues to feel nauseous. 1 liter D5LR and 4 mg IV Zofran ordered 2020 - Patient getting IV fluids. Care turned over to Wynelle Bourgeois, CNM Assessment and Plan  A:  Hyperemesis at 12 week gestaton--recurrent  P:  IV hydration     Patient has Zofran Rx at pharmacy-needs to pick it up.    KEY,EVE M 09/07/2013, 4:19 PM   Feels better. Able to keep down ice chips Will fillRx for Zofran tomorrow Has vitamin B6 already Followup in clinic

## 2013-09-07 NOTE — MAU Note (Addendum)
PT presents with complaints of vomiting for approximately 2 days. States she is not able to keep anything down.

## 2013-09-10 ENCOUNTER — Encounter (HOSPITAL_COMMUNITY): Payer: Self-pay | Admitting: *Deleted

## 2013-09-10 ENCOUNTER — Inpatient Hospital Stay (HOSPITAL_COMMUNITY)
Admission: AD | Admit: 2013-09-10 | Discharge: 2013-09-10 | Disposition: A | Discharge status: Home / Self Care | Payer: Medicaid Other | Source: Ambulatory Visit | Attending: Obstetrics & Gynecology | Admitting: Obstetrics & Gynecology

## 2013-09-10 DIAGNOSIS — O0930 Supervision of pregnancy with insufficient antenatal care, unspecified trimester: Secondary | ICD-10-CM | POA: Insufficient documentation

## 2013-09-10 DIAGNOSIS — O210 Mild hyperemesis gravidarum: Secondary | ICD-10-CM

## 2013-09-10 LAB — URINALYSIS, ROUTINE W REFLEX MICROSCOPIC
Glucose, UA: NEGATIVE mg/dL
Ketones, ur: >80 mg/dL — AB
Leukocytes, UA: NEGATIVE
Protein, ur: 100 mg/dL — AB

## 2013-09-10 LAB — URINE MICROSCOPIC-ADD ON

## 2013-09-10 MED ORDER — LACTATED RINGERS IV SOLN
Freq: Once | INTRAVENOUS | Status: AC
  Administered 2013-09-10: 11:00:00 via INTRAVENOUS
  Filled 2013-09-10: qty 1000

## 2013-09-10 MED ORDER — FAMOTIDINE IN NACL 20-0.9 MG/50ML-% IV SOLN
20.0000 mg | Freq: Once | INTRAVENOUS | Status: AC
  Administered 2013-09-10: 20 mg via INTRAVENOUS
  Filled 2013-09-10: qty 50

## 2013-09-10 MED ORDER — ONDANSETRON 8 MG/NS 50 ML IVPB
8.0000 mg | Freq: Once | INTRAVENOUS | Status: AC
  Administered 2013-09-10: 8 mg via INTRAVENOUS
  Filled 2013-09-10: qty 8

## 2013-09-10 MED ORDER — PROMETHAZINE HCL 25 MG/ML IJ SOLN
25.0000 mg | Freq: Once | INTRAVENOUS | Status: AC
  Administered 2013-09-10: 25 mg via INTRAVENOUS
  Filled 2013-09-10: qty 1

## 2013-09-10 NOTE — MAU Note (Signed)
Pt presents with complaints of nausea and vomiting. She states she does have meds for the nausea but it does not really help. Denies any bleeding or cramping

## 2013-09-10 NOTE — MAU Provider Note (Signed)
History     CSN: [id number]  Arrival date and time: 09/10/13 0758   None     Chief Complaint  Patient presents with  . Emesis During Pregnancy   HPI  [name] is a 27 y.o. female [phone] at [redacted]w[redacted]d who presents to MAU with complaints of N/V. She has vomited 10 times since last night. Last time she took a suppository phenergan was last night before bed, and during the night. She put the suppository in and went to bed; she did not attempt to eat any food. She has been to MAU several times with this pregnancy for N/V. Weights 08/27/2013- 134 lbs, 09/07/2013- 140 lbs, 09/10/2013-137 lbs  OB History   Grav Para Term Preterm Abortions TAB SAB Ect Mult Living   5 1 1  3 3    1       Past Medical History  Diagnosis Date  . [id number](440.1)     Past Surgical History  Procedure Laterality Date  . Cesarean section    . Induced abortion      Family History  Problem Relation Age of Onset  . Diabetes Mother   . Diabetes Maternal Grandmother   . Diabetes Paternal Grandmother     History  Substance Use Topics  . Smoking status: Former Smoker -- 0.25 packs/day for 12 years    Types: Cigarettes    Quit date: 07/13/2013  . Smokeless tobacco: Not on file  . Alcohol Use: No    Allergies: No Known Allergies  Prescriptions prior to admission  Medication Sig Dispense Refill  . metoCLOPramide (REGLAN) 10 MG tablet Take 1 tablet (10 mg total) by mouth 4 (four) times daily.  180 tablet  3  . promethazine (PHENERGAN) 25 MG suppository Place 1 suppository (25 mg total) rectally every 6 (six) hours as needed for nausea.  12 each  0  . Pyridoxine HCl (VITAMIN B-6 PO) Take 1 tablet by mouth daily.      . ranitidine (ZANTAC) 150 MG tablet Take 1 tablet (150 mg total) by mouth 2 (two) times daily.  60 tablet  3  . ondansetron (ZOFRAN) 4 MG tablet Take 1 tablet (4 mg total) by mouth every 6 (six) hours.  12 tablet  0    Review of Systems  Constitutional: Positive for weight loss.  Negative for fever and chills.  Gastrointestinal: Positive for nausea and vomiting. Negative for abdominal pain, diarrhea and constipation.  Genitourinary: Negative for dysuria, urgency, frequency and hematuria.       No vaginal discharge. No vaginal bleeding. No dysuria.   Neurological: Positive for weakness and headaches.   Physical Exam   Blood pressure 123/85, pulse 99, temperature 99.1 F (37.3 C), temperature source Oral, resp. rate 18, last menstrual period 06/15/2013. Fetal heart rate by doppler 150 bpm  Physical Exam  Constitutional: She is oriented to person, place, and time. She appears well-developed and well-nourished. No distress.  Eyes: Pupils are equal, round, and reactive to light.  Neck: Normal range of motion. Neck supple.  Cardiovascular: Normal rate and regular rhythm.   Respiratory: Effort normal.  GI: Soft.  Neurological: She is alert and oriented to person, place, and time.  Skin: Skin is warm and dry. She is not diaphoretic.  Psychiatric: She has a normal mood and affect. Her behavior is normal.    MAU Course  Procedures None  MDM +fht D5LR 1 liter bolus + phenergan  LR with multivitamins bolus  Pepcid 20 mg IVPB Zofran 8  mg IVPB Patient has vomited one time while in MAU  1315: Pt tolerated Crackers and sprite in MAU with any further episodes of vomiting   Assessment and Plan  A: Nausea and vomiting in pregnancy  No prenatal care   P: Discharge home Return to MAU as needed, if symptoms worsen Take your medication, wait 30 minutes and try to eat something.  Continue medication as prescribed for N/V Referral made to the clinic; they will call to schedule appointment  BRAT diet discussed    Bradleigh Sonnen IRENE FNP-C 09/10/2013, 1:26 PM

## 2013-09-11 LAB — URINE CULTURE

## 2013-09-12 NOTE — MAU Provider Note (Signed)
Attestation of Attending Supervision of Advanced Practitioner (CNM/NP): Evaluation and management procedures were performed by the Advanced Practitioner under my supervision and collaboration.  I have reviewed the Advanced Practitioner's note and chart, and I agree with the management and plan.  HARRAWAY-SMITH, Kazue Cerro 2:08 PM

## 2013-09-18 ENCOUNTER — Inpatient Hospital Stay (HOSPITAL_COMMUNITY)
Admission: AD | Admit: 2013-09-18 | Discharge: 2013-09-18 | Disposition: A | Discharge status: Home / Self Care | Payer: Medicaid Other | Source: Ambulatory Visit | Attending: Family Medicine | Admitting: Family Medicine

## 2013-09-18 DIAGNOSIS — O219 Vomiting of pregnancy, unspecified: Secondary | ICD-10-CM

## 2013-09-18 DIAGNOSIS — O210 Mild hyperemesis gravidarum: Secondary | ICD-10-CM | POA: Insufficient documentation

## 2013-09-18 LAB — CBC
Hemoglobin: 14 g/dL (ref 12.0–15.0)
RBC: 4.27 MIL/uL (ref 3.87–5.11)
WBC: 10.5 10*3/uL (ref 4.0–10.5)

## 2013-09-18 LAB — COMPREHENSIVE METABOLIC PANEL
AST: 22 U/L (ref 0–37)
Albumin: 4.3 g/dL (ref 3.5–5.2)
Alkaline Phosphatase: 76 U/L (ref 39–117)
BUN: 11 mg/dL (ref 6–23)
Chloride: 103 mEq/L (ref 96–112)
GFR calc Af Amer: >90 mL/min (ref >90)
Potassium: 3.8 mEq/L (ref 3.5–5.1)
Total Protein: 7.9 g/dL (ref 6.0–8.3)

## 2013-09-18 LAB — URINE MICROSCOPIC-ADD ON

## 2013-09-18 LAB — URINALYSIS, ROUTINE W REFLEX MICROSCOPIC
Glucose, UA: NEGATIVE mg/dL
Ketones, ur: 40 mg/dL — AB
Protein, ur: 30 mg/dL — AB

## 2013-09-18 MED ORDER — ONDANSETRON 8 MG PO TBDP
8.0000 mg | ORAL_TABLET | Freq: Once | ORAL | Status: AC
  Administered 2013-09-18: 8 mg via ORAL
  Filled 2013-09-18: qty 1

## 2013-09-18 MED ORDER — PROMETHAZINE HCL 25 MG/ML IJ SOLN
25.0000 mg | Freq: Once | INTRAVENOUS | Status: AC
  Administered 2013-09-18: 25 mg via INTRAVENOUS
  Filled 2013-09-18: qty 1

## 2013-09-18 MED ORDER — METOCLOPRAMIDE HCL 5 MG/ML IJ SOLN
10.0000 mg | Freq: Once | INTRAMUSCULAR | Status: AC
  Administered 2013-09-18: 10 mg via INTRAVENOUS
  Filled 2013-09-18: qty 2

## 2013-09-18 MED ORDER — LACTATED RINGERS IV BOLUS (SEPSIS)
1000.0000 mL | Freq: Once | INTRAVENOUS | Status: AC
  Administered 2013-09-18: 1000 mL via INTRAVENOUS

## 2013-09-18 MED ORDER — SODIUM CHLORIDE 0.9 % IV SOLN
INTRAVENOUS | Status: DC
  Administered 2013-09-18: 14:00:00 via INTRAVENOUS

## 2013-09-18 NOTE — MAU Note (Signed)
Pt presents to hospital with c/o severe vomiting for 2 days. Pt states vomiting is from "morning sickness".  Pt. States the vomiting lasts all day and she is not able to eat.

## 2013-09-18 NOTE — MAU Provider Note (Signed)
Chart reviewed and agree with management and plan.  

## 2013-09-18 NOTE — MAU Provider Note (Signed)
History     CSN: [id number]  Arrival date and time: 09/18/13 0747   First Provider Initiated Contact with Patient 09/18/13 [phone]      Chief Complaint  Patient presents with  . Emesis During Pregnancy   HPI [name] is a 27 y.o. F6O1308 at [redacted]w[redacted]d who presents to MAU today with N/V. The patient has had N/V throughout the pregnancy. The states that she was taking Phenergan and Reglan within the last week and was feeling better so she stopped taking them because they make her sleepy. She states severe N/V x 2 days with headache. She denies diarrhea, fever, abdominal pain, vaginal bleeding or UTI symptoms today. Weights 08/27/2013- 134 lbs, 09/07/2013- 140 lbs, 09/10/2013-137 lbs, 09/18/13 - 135 lbs   OB History   Grav Para Term Preterm Abortions TAB SAB Ect Mult Living   5 1 1  3 3    1       Past Medical History  Diagnosis Date  . MVHQIONG(295.2)     Past Surgical History  Procedure Laterality Date  . Cesarean section    . Induced abortion      Family History  Problem Relation Age of Onset  . Diabetes Mother   . Diabetes Maternal Grandmother   . Diabetes Paternal Grandmother     History  Substance Use Topics  . Smoking status: Former Smoker -- 0.25 packs/day for 12 years    Types: Cigarettes    Quit date: 07/13/2013  . Smokeless tobacco: Not on file  . Alcohol Use: No    Allergies: No Known Allergies  Prescriptions prior to admission  Medication Sig Dispense Refill  . metoCLOPramide (REGLAN) 10 MG tablet Take 1 tablet (10 mg total) by mouth 4 (four) times daily.  180 tablet  3  . promethazine (PHENERGAN) 25 MG suppository Place 1 suppository (25 mg total) rectally every 6 (six) hours as needed for nausea.  12 each  0  . Pyridoxine HCl (VITAMIN B-6 PO) Take 1 tablet by mouth daily.      . ranitidine (ZANTAC) 150 MG tablet Take 1 tablet (150 mg total) by mouth 2 (two) times daily.  60 tablet  3    Review of Systems  Constitutional: Positive for  malaise/fatigue. Negative for fever.  Gastrointestinal: Positive for nausea and vomiting. Negative for abdominal pain and diarrhea.  Genitourinary: Negative for dysuria, urgency and frequency.       Neg - vaginal bleeding, discharge  Neurological: Positive for weakness. Negative for dizziness and loss of consciousness.   Physical Exam   Blood pressure 103/64, pulse 92, temperature 97.3 F (36.3 C), temperature source Oral, resp. rate 18, height 5\' 3"  (1.6 m), weight 135 lb (61.236 kg), last menstrual period 06/15/2013.  Physical Exam  Constitutional: She is oriented to person, place, and time. She appears well-developed and well-nourished. No distress.  HENT:  Head: Normocephalic and atraumatic.  Cardiovascular: Normal rate.   Respiratory: Effort normal.  GI: Soft. She exhibits no distension and no mass. There is no tenderness. There is no rebound and no guarding.  Neurological: She is alert and oriented to person, place, and time.  Skin: Skin is warm and dry. No erythema.  Psychiatric: She has a normal mood and affect.   Results for orders placed during the hospital encounter of 09/18/13 (from the past 24 hour(s))  URINALYSIS, ROUTINE W REFLEX MICROSCOPIC     Status: Abnormal   Collection Time    09/18/13  7:50 AM  Result Value Range   Color, Urine YELLOW  YELLOW   APPearance CLOUDY (*) CLEAR   Specific Gravity, Urine >1.030 (*) 1.005 - 1.030   pH 6.0  5.0 - 8.0   Glucose, UA NEGATIVE  NEGATIVE mg/dL   Hgb urine dipstick TRACE (*) NEGATIVE   Bilirubin Urine SMALL (*) NEGATIVE   Ketones, ur 40 (*) NEGATIVE mg/dL   Protein, ur 30 (*) NEGATIVE mg/dL   Urobilinogen, UA 0.2  0.0 - 1.0 mg/dL   Nitrite NEGATIVE  NEGATIVE   Leukocytes, UA NEGATIVE  NEGATIVE  URINE MICROSCOPIC-ADD ON     Status: Abnormal   Collection Time    09/18/13  7:50 AM      Result Value Range   Squamous Epithelial / LPF FEW (*) RARE   WBC, UA 0-2  <3 WBC/hpf   RBC / HPF 3-6  <3 RBC/hpf   Bacteria, UA  FEW (*) RARE   Urine-Other MUCOUS PRESENT    CBC     Status: None   Collection Time    09/18/13  9:37 AM      Result Value Range   WBC 10.5  4.0 - 10.5 K/uL   RBC 4.27  3.87 - 5.11 MIL/uL   Hemoglobin 14.0  12.0 - 15.0 g/dL   HCT 16.1  09.6 - 04.5 %   MCV 93.9  78.0 - 100.0 fL   MCH 32.8  26.0 - 34.0 pg   MCHC 34.9  30.0 - 36.0 g/dL   RDW 40.9  81.1 - 91.4 %   Platelets 322  150 - 400 K/uL  COMPREHENSIVE METABOLIC PANEL     Status: None   Collection Time    09/18/13  9:37 AM      Result Value Range   Sodium 139  135 - 145 mEq/L   Potassium 3.8  3.5 - 5.1 mEq/L   Chloride 103  96 - 112 mEq/L   CO2 23  19 - 32 mEq/L   Glucose, Bld 94  70 - 99 mg/dL   BUN 11  6 - 23 mg/dL   Creatinine, Ser 7.82  0.50 - 1.10 mg/dL   Calcium 95.6  8.4 - 21.3 mg/dL   Total Protein 7.9  6.0 - 8.3 g/dL   Albumin 4.3  3.5 - 5.2 g/dL   AST 22  0 - 37 U/L   ALT 19  0 - 35 U/L   Alkaline Phosphatase 76  39 - 117 U/L   Total Bilirubin 0.4  0.3 - 1.2 mg/dL   GFR calc non Af Amer >90  >90 mL/min   GFR calc Af Amer >90  >90 mL/min    MAU Course  Procedures None  MDM UA today shows signs of dehydration IV Phenergan infusion in D5LR given - patient reports improvement in symptoms and no additional episodes of emesis since medications given Labs are WNL 1 liter LR bolus - patient still has not vomited in MAU, but feels nauseous 8 mg ODT Zofran given - patient able to tolerate PO in MAU Just prior to discharge patient states that she is vomiting bile and she doesn't feel right.  Patient still has IV access. Will give 10 mg Reglan IV - patient now reports significant improvement in symptoms and is able to tolerate PO without emesis Assessment and Plan  A: Nausea and vomiting in pregnancy prior to [redacted] weeks gestation  P: Discharge home Patient advised to resume previously prescribed medications regularly Patient advised to start prenatal care  ASAP Patient may return to MAU as needed or if her  condition were to change or worsen  Freddi Starr, PA-C  09/18/2013, 2:56 PM

## 2013-09-26 ENCOUNTER — Inpatient Hospital Stay (HOSPITAL_COMMUNITY)
Admission: AD | Admit: 2013-09-26 | Discharge: 2013-09-26 | Disposition: A | Discharge status: Home / Self Care | Payer: Medicaid Other | Source: Ambulatory Visit | Attending: Family Medicine | Admitting: Family Medicine

## 2013-09-26 ENCOUNTER — Encounter (HOSPITAL_COMMUNITY): Payer: Self-pay

## 2013-09-26 DIAGNOSIS — O210 Mild hyperemesis gravidarum: Secondary | ICD-10-CM | POA: Insufficient documentation

## 2013-09-26 LAB — CBC
HCT: 34 % — ABNORMAL LOW (ref 36.0–46.0)
Hemoglobin: 12 g/dL (ref 12.0–15.0)
MCH: 32.4 pg (ref 26.0–34.0)
RBC: 3.7 MIL/uL — ABNORMAL LOW (ref 3.87–5.11)
WBC: 9.9 10*3/uL (ref 4.0–10.5)

## 2013-09-26 LAB — COMPREHENSIVE METABOLIC PANEL
Albumin: 3.6 g/dL (ref 3.5–5.2)
Alkaline Phosphatase: 64 U/L (ref 39–117)
BUN: 6 mg/dL (ref 6–23)
Chloride: 102 mEq/L (ref 96–112)
Creatinine, Ser: 0.6 mg/dL (ref 0.50–1.10)
GFR calc Af Amer: >90 mL/min (ref >90)
GFR calc non Af Amer: >90 mL/min (ref >90)
Glucose, Bld: 145 mg/dL — ABNORMAL HIGH (ref 70–99)
Potassium: 2.8 mEq/L — ABNORMAL LOW (ref 3.5–5.1)
Total Bilirubin: 0.5 mg/dL (ref 0.3–1.2)

## 2013-09-26 LAB — URINALYSIS, ROUTINE W REFLEX MICROSCOPIC
Glucose, UA: NEGATIVE mg/dL
Ketones, ur: >80 mg/dL — AB
Leukocytes, UA: NEGATIVE
Nitrite: NEGATIVE
Specific Gravity, Urine: >1.03 — ABNORMAL HIGH (ref 1.005–1.030)
pH: 6 (ref 5.0–8.0)

## 2013-09-26 LAB — URINE MICROSCOPIC-ADD ON

## 2013-09-26 MED ORDER — PROMETHAZINE HCL 25 MG/ML IJ SOLN
25.0000 mg | Freq: Once | INTRAVENOUS | Status: AC
  Administered 2013-09-26: 25 mg via INTRAVENOUS
  Filled 2013-09-26: qty 1

## 2013-09-26 MED ORDER — FAMOTIDINE IN NACL 20-0.9 MG/50ML-% IV SOLN
20.0000 mg | INTRAVENOUS | Status: AC
  Administered 2013-09-26: 20 mg via INTRAVENOUS
  Filled 2013-09-26: qty 50

## 2013-09-26 MED ORDER — POTASSIUM CHLORIDE 2 MEQ/ML IV SOLN
INTRAVENOUS | Status: AC
  Administered 2013-09-26: 21:00:00 via INTRAVENOUS
  Filled 2013-09-26: qty 1000

## 2013-09-26 MED ORDER — ONDANSETRON HCL 4 MG/2ML IJ SOLN
4.0000 mg | INTRAMUSCULAR | Status: AC
  Administered 2013-09-26: 4 mg via INTRAVENOUS
  Filled 2013-09-26: qty 2

## 2013-09-26 NOTE — MAU Provider Note (Signed)
Chief Complaint: Emesis During Pregnancy   First Provider Initiated Contact with Patient 09/26/13 1901     SUBJECTIVE HPI: Kristen Phillips is a 27 y.o. G5P1031 at [redacted]w[redacted]d by LMP who presents to maternity admissions reporting nausea with vomiting several times today.  She reports she has not been able to keep anything down all day today.  She has had n/v in pregnancy and has medications prescribed for this. 3-4 days ago she stopped taking Phenergan and Zantac and is taking Reglan only.  She was doing well with one medication until today. She has not started prenatal care and does not know who she would like to see.  She is currently waiting for her Medicaid. She denies LOF, vaginal bleeding, vaginal itching/burning, urinary symptoms, h/a, dizziness, or fever/chills.     Past Medical History  Diagnosis Date  . NWGNFAOZ(308.6)    Past Surgical History  Procedure Laterality Date  . Cesarean section    . Induced abortion     History   Social History  . Marital Status: Single    Spouse Name: N/A    Number of Children: N/A  . Years of Education: N/A   Occupational History  . Not on file.   Social History Main Topics  . Smoking status: Former Smoker -- 0.25 packs/day for 12 years    Types: Cigarettes    Quit date: 07/13/2013  . Smokeless tobacco: Not on file  . Alcohol Use: No  . Drug Use: No  . Sexual Activity: Not Currently    Birth Control/ Protection: None     Comment: Several weeks since last intercourse   Other Topics Concern  . Not on file   Social History Narrative  . No narrative on file   No current facility-administered medications on file prior to encounter.   Current Outpatient Prescriptions on File Prior to Encounter  Medication Sig Dispense Refill  . metoCLOPramide (REGLAN) 10 MG tablet Take 1 tablet (10 mg total) by mouth 4 (four) times daily.  180 tablet  3  . promethazine (PHENERGAN) 25 MG suppository Place 1 suppository (25 mg total) rectally every 6  (six) hours as needed for nausea.  12 each  0  . Pyridoxine HCl (VITAMIN B-6 PO) Take 1 tablet by mouth daily.      . ranitidine (ZANTAC) 150 MG tablet Take 1 tablet (150 mg total) by mouth 2 (two) times daily.  60 tablet  3   No Known Allergies  ROS: Pertinent items in HPI  OBJECTIVE Blood pressure 112/75, pulse 97, temperature 98.2 F (36.8 C), temperature source Oral, resp. rate 16, weight 60.873 kg (134 lb 3.2 oz), last menstrual period 06/15/2013. GENERAL: Well-developed, well-nourished female in no acute distress.  HEENT: Normocephalic HEART: normal rate RESP: normal effort ABDOMEN: Soft, non-tender EXTREMITIES: Nontender, no edema NEURO: Alert and oriented  FHT: 150  LAB RESULTS Results for orders placed during the hospital encounter of 09/26/13 (from the past 24 hour(s))  URINALYSIS, ROUTINE W REFLEX MICROSCOPIC     Status: Abnormal   Collection Time    09/26/13  5:40 PM      Result Value Range   Color, Urine AMBER (*) YELLOW   APPearance CLEAR  CLEAR   Specific Gravity, Urine >1.030 (*) 1.005 - 1.030   pH 6.0  5.0 - 8.0   Glucose, UA NEGATIVE  NEGATIVE mg/dL   Hgb urine dipstick NEGATIVE  NEGATIVE   Bilirubin Urine SMALL (*) NEGATIVE   Ketones, ur >80 (*) NEGATIVE mg/dL  Protein, ur 100 (*) NEGATIVE mg/dL   Urobilinogen, UA 2.0 (*) 0.0 - 1.0 mg/dL   Nitrite NEGATIVE  NEGATIVE   Leukocytes, UA NEGATIVE  NEGATIVE  URINE MICROSCOPIC-ADD ON     Status: Abnormal   Collection Time    09/26/13  5:40 PM      Result Value Range   Squamous Epithelial / LPF FEW (*) RARE   WBC, UA 3-6  <3 WBC/hpf   RBC / HPF 3-6  <3 RBC/hpf   Bacteria, UA FEW (*) RARE   Urine-Other MUCOUS PRESENT    CBC     Status: Abnormal   Collection Time    09/26/13  7:41 PM      Result Value Range   WBC 9.9  4.0 - 10.5 K/uL   RBC 3.70 (*) 3.87 - 5.11 MIL/uL   Hemoglobin 12.0  12.0 - 15.0 g/dL   HCT 16.1 (*) 09.6 - 04.5 %   MCV 91.9  78.0 - 100.0 fL   MCH 32.4  26.0 - 34.0 pg   MCHC 35.3   30.0 - 36.0 g/dL   RDW 40.9  81.1 - 91.4 %   Platelets 276  150 - 400 K/uL  COMPREHENSIVE METABOLIC PANEL     Status: Abnormal   Collection Time    09/26/13  7:41 PM      Result Value Range   Sodium 136  135 - 145 mEq/L   Potassium 2.8 (*) 3.5 - 5.1 mEq/L   Chloride 102  96 - 112 mEq/L   CO2 23  19 - 32 mEq/L   Glucose, Bld 145 (*) 70 - 99 mg/dL   BUN 6  6 - 23 mg/dL   Creatinine, Ser 7.82  0.50 - 1.10 mg/dL   Calcium 9.5  8.4 - 95.6 mg/dL   Total Protein 7.1  6.0 - 8.3 g/dL   Albumin 3.6  3.5 - 5.2 g/dL   AST 17  0 - 37 U/L   ALT 14  0 - 35 U/L   Alkaline Phosphatase 64  39 - 117 U/L   Total Bilirubin 0.5  0.3 - 1.2 mg/dL   GFR calc non Af Amer >90  >90 mL/min   GFR calc Af Amer >90  >90 mL/min    Phenergan 25 mg in  D5LR x 1000 ml, Pepcid 20 mg IV, Zofran 4 mg IV given in MAU Multivitamin plus 10 K in 1000 ml LR  Report to Jannifer Rodney, NP   Sharen Counter Certified Nurse-Midwife 09/26/2013  8:48 PM    Jannifer Rodney, NP   A: Hyperemesis Gravidarum  P: 2 liters IVF, continue nausea meds she has been given, start prenatal care asap Advised pt not to drive home as she has had phenergan in MAU and she states she is not sleepy and will be fine

## 2013-09-26 NOTE — MAU Note (Addendum)
Ongoing problem with vomiting, comes and goes. Past 3 or  4 days has been feeling light headed

## 2013-09-26 NOTE — MAU Note (Signed)
Patient is in with c/o n/v. She states that she stopped placing the phenergan suppository 3 days ago because she felt better. She last took her po reglan this morning at 0800am. She denies any bleeding.

## 2013-09-27 LAB — URINE CULTURE

## 2013-09-27 NOTE — MAU Provider Note (Signed)
Chart reviewed and agree with management and plan.  

## 2013-10-01 ENCOUNTER — Encounter (HOSPITAL_COMMUNITY): Payer: Self-pay | Admitting: *Deleted

## 2013-10-01 ENCOUNTER — Encounter: Payer: Medicaid Other | Admitting: Obstetrics & Gynecology

## 2013-10-01 ENCOUNTER — Inpatient Hospital Stay (HOSPITAL_COMMUNITY)
Admission: AD | Admit: 2013-10-01 | Discharge: 2013-10-01 | Disposition: A | Discharge status: Home / Self Care | Payer: Medicaid Other | Source: Ambulatory Visit | Attending: Obstetrics & Gynecology | Admitting: Obstetrics & Gynecology

## 2013-10-01 DIAGNOSIS — O210 Mild hyperemesis gravidarum: Secondary | ICD-10-CM

## 2013-10-01 DIAGNOSIS — R197 Diarrhea, unspecified: Secondary | ICD-10-CM | POA: Insufficient documentation

## 2013-10-01 LAB — URINE MICROSCOPIC-ADD ON

## 2013-10-01 LAB — URINALYSIS, ROUTINE W REFLEX MICROSCOPIC
Glucose, UA: NEGATIVE mg/dL
Ketones, ur: 15 mg/dL — AB
Leukocytes, UA: NEGATIVE
Protein, ur: 30 mg/dL — AB
Urobilinogen, UA: 1 mg/dL (ref 0.0–1.0)
pH: 6.5 (ref 5.0–8.0)

## 2013-10-01 LAB — COMPREHENSIVE METABOLIC PANEL
ALT: 14 U/L (ref 0–35)
Albumin: 3.4 g/dL — ABNORMAL LOW (ref 3.5–5.2)
Alkaline Phosphatase: 62 U/L (ref 39–117)
BUN: 4 mg/dL — ABNORMAL LOW (ref 6–23)
CO2: 24 mEq/L (ref 19–32)
Chloride: 102 mEq/L (ref 96–112)
Creatinine, Ser: 0.6 mg/dL (ref 0.50–1.10)
GFR calc Af Amer: >90 mL/min (ref >90)
GFR calc non Af Amer: >90 mL/min (ref >90)
Glucose, Bld: 79 mg/dL (ref 70–99)
Potassium: 3 mEq/L — ABNORMAL LOW (ref 3.5–5.1)
Total Bilirubin: 0.4 mg/dL (ref 0.3–1.2)

## 2013-10-01 LAB — CBC
HCT: 32.6 % — ABNORMAL LOW (ref 36.0–46.0)
Hemoglobin: 11.3 g/dL — ABNORMAL LOW (ref 12.0–15.0)
MCV: 92.4 fL (ref 78.0–100.0)
RDW: 12.4 % (ref 11.5–15.5)
WBC: 8.7 10*3/uL (ref 4.0–10.5)

## 2013-10-01 MED ORDER — ONDANSETRON 4 MG PO TBDP: 4.0000 mg | ORAL_TABLET | Freq: Three times a day (TID) | ORAL | Status: DC | PRN

## 2013-10-01 MED ORDER — ONDANSETRON 8 MG PO TBDP
8.0000 mg | ORAL_TABLET | Freq: Once | ORAL | Status: DC
  Filled 2013-10-01: qty 1

## 2013-10-01 MED ORDER — PROMETHAZINE HCL 25 MG RE SUPP: 25.0000 mg | Freq: Four times a day (QID) | RECTAL | Status: DC | PRN

## 2013-10-01 MED ORDER — PROMETHAZINE HCL 25 MG RE SUPP
25.0000 mg | Freq: Once | RECTAL | Status: AC
  Administered 2013-10-01: 25 mg via RECTAL
  Filled 2013-10-01: qty 1

## 2013-10-01 MED ORDER — ONDANSETRON 4 MG PO TBDP
4.0000 mg | ORAL_TABLET | Freq: Once | ORAL | Status: AC
  Administered 2013-10-01: 4 mg via ORAL
  Filled 2013-10-01: qty 1

## 2013-10-01 NOTE — MAU Note (Signed)
Pt C/O vomiting throughout her pregnancy, diarrhea started yesterday, denies fever.  No one else in her home is sick.

## 2013-10-01 NOTE — MAU Note (Signed)
Pt C/O sore throat, hurts when vomiting or swallowing.

## 2013-10-01 NOTE — MAU Provider Note (Signed)
Attestation of Attending Supervision of Advanced Practitioner (CNM/NP): Evaluation and management procedures were performed by the Advanced Practitioner under my supervision and collaboration.  I have reviewed the Advanced Practitioner's note and chart, and I agree with the management and plan.  HARRAWAY-SMITH, Sheilyn Boehlke 7:24 PM     

## 2013-10-01 NOTE — MAU Provider Note (Signed)
History     CSN: [id number]  Arrival date and time: 10/01/13 1300   First Provider Initiated Contact with Patient 10/01/13 1321      Chief Complaint  Patient presents with  . Emesis  . Diarrhea   HPI  [name] is 27 y.o. female [redacted]w[redacted]d [phone] presents for two day history of diarrhea, along with N/V.  Patient has been treated for N/V since her 6th week of pregnancy, which is currently still not under control. She is taking PO Reglan, phenergan, and Zantac. She says this med combination has helped if she takes it. She admits to not taking the medication as prescribed lately.  Her diarrhea started yesterday morning. She says that it was a lot of loose stool the first couple of BM's but today it is less volume, but still loose. She denies abdominal pain. She denies sick contacts or eating off tasting food. She denies vaginal discharge, or LOF. She desires to start prenatal care at [facility] once she receives her medicaid.   OB History   Grav Para Term Preterm Abortions TAB SAB Ect Mult Living   5 1 1  3 3    1       Past Medical History  Diagnosis Date  . ZHYQMVHQ(469.6)     Past Surgical History  Procedure Laterality Date  . Cesarean section    . Induced abortion      Family History  Problem Relation Age of Onset  . Diabetes Mother   . Diabetes Maternal Grandmother   . Diabetes Paternal Grandmother     History  Substance Use Topics  . Smoking status: Former Smoker -- 0.25 packs/day for 12 years    Types: Cigarettes    Quit date: 07/13/2013  . Smokeless tobacco: Not on file  . Alcohol Use: No    Allergies: No Known Allergies  Prescriptions prior to admission  Medication Sig Dispense Refill  . metoCLOPramide (REGLAN) 10 MG tablet Take 1 tablet (10 mg total) by mouth 4 (four) times daily.  180 tablet  3  . promethazine (PHENERGAN) 25 MG tablet Take 25 mg by mouth daily as needed for nausea.      . Pyridoxine HCl (VITAMIN B-6 PO) Take 1 tablet by  mouth daily.      . ranitidine (ZANTAC) 150 MG tablet Take 150 mg by mouth daily as needed for heartburn.       Results for orders placed during the hospital encounter of 10/01/13 (from the past 24 hour(s))  URINALYSIS, ROUTINE W REFLEX MICROSCOPIC     Status: Abnormal   Collection Time    10/01/13  1:15 PM      Result Value Range   Color, Urine YELLOW  YELLOW   APPearance HAZY (*) CLEAR   Specific Gravity, Urine >1.030 (*) 1.005 - 1.030   pH 6.5  5.0 - 8.0   Glucose, UA NEGATIVE  NEGATIVE mg/dL   Hgb urine dipstick NEGATIVE  NEGATIVE   Bilirubin Urine SMALL (*) NEGATIVE   Ketones, ur 15 (*) NEGATIVE mg/dL   Protein, ur 30 (*) NEGATIVE mg/dL   Urobilinogen, UA 1.0  0.0 - 1.0 mg/dL   Nitrite NEGATIVE  NEGATIVE   Leukocytes, UA NEGATIVE  NEGATIVE  URINE MICROSCOPIC-ADD ON     Status: Abnormal   Collection Time    10/01/13  1:15 PM      Result Value Range   Squamous Epithelial / LPF FEW (*) RARE   WBC, UA 0-2  <3 WBC/hpf  Urine-Other MUCOUS PRESENT    CBC     Status: Abnormal   Collection Time    10/01/13  2:28 PM      Result Value Range   WBC 8.7  4.0 - 10.5 K/uL   RBC 3.53 (*) 3.87 - 5.11 MIL/uL   Hemoglobin 11.3 (*) 12.0 - 15.0 g/dL   HCT 44.0 (*) 34.7 - 42.5 %   MCV 92.4  78.0 - 100.0 fL   MCH 32.0  26.0 - 34.0 pg   MCHC 34.7  30.0 - 36.0 g/dL   RDW 95.6  38.7 - 56.4 %   Platelets 250  150 - 400 K/uL  COMPREHENSIVE METABOLIC PANEL     Status: Abnormal   Collection Time    10/01/13  2:28 PM      Result Value Range   Sodium 137  135 - 145 mEq/L   Potassium 3.0 (*) 3.5 - 5.1 mEq/L   Chloride 102  96 - 112 mEq/L   CO2 24  19 - 32 mEq/L   Glucose, Bld 79  70 - 99 mg/dL   BUN 4 (*) 6 - 23 mg/dL   Creatinine, Ser 3.32  0.50 - 1.10 mg/dL   Calcium 9.0  8.4 - 95.1 mg/dL   Total Protein 6.2  6.0 - 8.3 g/dL   Albumin 3.4 (*) 3.5 - 5.2 g/dL   AST 16  0 - 37 U/L   ALT 14  0 - 35 U/L   Alkaline Phosphatase 62  39 - 117 U/L   Total Bilirubin 0.4  0.3 - 1.2 mg/dL   GFR  calc non Af Amer >90  >90 mL/min   GFR calc Af Amer >90  >90 mL/min   Review of Systems  Constitutional: Negative for fever.  HENT: Negative for congestion.   Respiratory: Negative for cough and shortness of breath.   Cardiovascular: Negative for chest pain and palpitations.  Gastrointestinal: Positive for nausea, vomiting and diarrhea. Negative for heartburn, abdominal pain, constipation and blood in stool.  Genitourinary: Negative for dysuria, urgency and frequency.  Musculoskeletal: Negative for myalgias.  Skin: Negative.  Negative for itching and rash.  Neurological: Negative for dizziness, weakness and headaches.   Physical Exam   Blood pressure 103/56, pulse 87, temperature 98.5 F (36.9 C), temperature source Oral, resp. rate 16, last menstrual period 06/15/2013. Fetal heart rate by doppler 146 bpm.   Physical Exam  Constitutional: She is oriented to person, place, and time. She appears well-developed and well-nourished. No distress.  HENT:  Head: Normocephalic and atraumatic.  Cardiovascular: Normal rate, regular rhythm, normal heart sounds and intact distal pulses.   Respiratory: Effort normal and breath sounds normal.  GI: Soft. Bowel sounds are normal.  Neurological: She is alert and oriented to person, place, and time.  Skin: Skin is warm and dry. No rash noted. She is not diaphoretic. No erythema. No pallor.  Psychiatric: She has a normal mood and affect.    MAU Course  Procedures None   Assessment and Plan   Patient has been treated for N/V since 6th week of pregnancy. She says that he N/V has not changed in intensity over the past 48 hours. She has maintained her weight since her last visit at 134#'s. Diet recall: She said that she ate a [place] wing, peas, rice, cereal, and a slushy yesterday. Today she has eaten toast and an apple. She is still complaining of nausea. Patient was offered PO Zofran in MAU but refused. She agreed  to PR phenergan.  Patient CMP 5  days ago showed a K+ of 2.7. Will recheck today and replete K+ if indicated. Currently she denies muscle aching or cramping.  Patients blood glucose was elevated last visit. Will recheck on CMP today.  Will check CBC for WBC elevation.   +fht's   At this time I do not feel it is necessary to perform stool culture. Patient denies sick contacts and has not been out of the country. If diarrhea does not resolve in 3 days, will consider culture then.  Bing Plume 10/01/2013, 1:31 PM  Evaluation and management procedures were performed by the student above under my supervision and collaboration. I have reviewed the note and chart, and I agree with the management and plan.  A: Hyperemesis gravidarum  P: Discharge home RX: Zofran         Refilled phenergan Schedule an appointment to start prenatal care as soon as possible  Start drinking smart water and Gatorade to help replenish electrolytes.  Return to MAU if symptoms worsen. Small, frequent meals discussed   Iona Hansen Milayna Rotenberg, NP 10/01/2013 3:35 PM

## 2013-10-16 ENCOUNTER — Inpatient Hospital Stay (HOSPITAL_COMMUNITY)
Admission: AD | Admit: 2013-10-16 | Discharge: 2013-10-16 | Disposition: A | Discharge status: Home / Self Care | Payer: Medicaid Other | Source: Ambulatory Visit | Attending: Obstetrics and Gynecology | Admitting: Obstetrics and Gynecology

## 2013-10-16 ENCOUNTER — Encounter (HOSPITAL_COMMUNITY): Payer: Self-pay | Admitting: Family

## 2013-10-16 DIAGNOSIS — O211 Hyperemesis gravidarum with metabolic disturbance: Secondary | ICD-10-CM | POA: Insufficient documentation

## 2013-10-16 DIAGNOSIS — E860 Dehydration: Secondary | ICD-10-CM | POA: Insufficient documentation

## 2013-10-16 DIAGNOSIS — O210 Mild hyperemesis gravidarum: Secondary | ICD-10-CM

## 2013-10-16 LAB — URINALYSIS, ROUTINE W REFLEX MICROSCOPIC
Protein, ur: 100 mg/dL — AB
Urobilinogen, UA: 4 mg/dL — ABNORMAL HIGH (ref 0.0–1.0)

## 2013-10-16 LAB — URINE MICROSCOPIC-ADD ON

## 2013-10-16 MED ORDER — PROMETHAZINE HCL 25 MG/ML IJ SOLN
INTRAVENOUS | Status: DC
  Administered 2013-10-16: 10:00:00 via INTRAVENOUS
  Filled 2013-10-16 (×3): qty 1000

## 2013-10-16 MED ORDER — DEXTROSE IN LACTATED RINGERS 5 % IV SOLN
INTRAVENOUS | Status: DC
  Administered 2013-10-16: 14:00:00 via INTRAVENOUS

## 2013-10-16 MED ORDER — ONDANSETRON 8 MG PO TBDP
8.0000 mg | ORAL_TABLET | Freq: Once | ORAL | Status: AC
  Administered 2013-10-16: 8 mg via ORAL
  Filled 2013-10-16: qty 1

## 2013-10-16 MED ORDER — FAMOTIDINE IN NACL 20-0.9 MG/50ML-% IV SOLN
20.0000 mg | Freq: Once | INTRAVENOUS | Status: AC
  Administered 2013-10-16: 20 mg via INTRAVENOUS
  Filled 2013-10-16: qty 50

## 2013-10-16 MED ORDER — M.V.I. ADULT IV INJ
Freq: Once | INTRAVENOUS | Status: AC
  Administered 2013-10-16: 12:00:00 via INTRAVENOUS
  Filled 2013-10-16: qty 1000

## 2013-10-16 NOTE — MAU Note (Addendum)
27 yo, G5P1 at [redacted]w[redacted]d, presents to MAU with c/o N/V x 3 days; reports >15 emesis occurences in last 24 hrs. Patient has prescription for Phenergan; ran after last dose at 1700 yesterday. Also has Reglan, but states it makes her feel very tired.  Reports dizziness and weakness in last 3 days; patient has an appt at [facility] tomorrow.  Denies HSV, VB, LOF, contractions.

## 2013-10-16 NOTE — MAU Provider Note (Signed)
History     CSN: [id number]  Arrival date and time: 10/16/13 6578   None     No chief complaint on file.  HPI Comments: Kristen Phillips 27 y.I.[id number] presents MAU for nausea, vomiting and dehydration. This is her 9th visit to MAU for same. She will start NOB care tomorrow with [facility]. She has at home phenergan pills and suppository, zofran, and reglan.       Past Medical History  Diagnosis Date  . WUXLKGMW(102.7)     Past Surgical History  Procedure Laterality Date  . Cesarean section    . Induced abortion      Family History  Problem Relation Age of Onset  . Diabetes Mother   . Diabetes Maternal Grandmother   . Diabetes Paternal Grandmother     History  Substance Use Topics  . Smoking status: Former Smoker -- 0.25 packs/day for 12 years    Types: Cigarettes    Quit date: 07/13/2013  . Smokeless tobacco: Not on file  . Alcohol Use: No    Allergies: No Known Allergies  Prescriptions prior to admission  Medication Sig Dispense Refill  . metoCLOPramide (REGLAN) 10 MG tablet Take 1 tablet (10 mg total) by mouth 4 (four) times daily.  180 tablet  3  . ondansetron (ZOFRAN ODT) 4 MG disintegrating tablet Take 1 tablet (4 mg total) by mouth every 8 (eight) hours as needed for nausea.  10 tablet  0  . promethazine (PHENERGAN) 25 MG suppository Place 1 suppository (25 mg total) rectally every 6 (six) hours as needed for nausea.  20 each  0  . promethazine (PHENERGAN) 25 MG tablet Take 25 mg by mouth daily as needed for nausea.      . Pyridoxine HCl (VITAMIN B-6 PO) Take 1 tablet by mouth daily.      . ranitidine (ZANTAC) 150 MG tablet Take 150 mg by mouth daily as needed for heartburn.        Review of Systems  HENT: Negative.   Respiratory: Negative.   Cardiovascular: Negative.   Gastrointestinal: Positive for nausea and vomiting.  Genitourinary: Negative.   Musculoskeletal: Negative.   Skin: Negative.   Neurological: Positive for weakness.   Psychiatric/Behavioral: Negative.    Physical Exam   Blood pressure 113/77, pulse 109, temperature 98.4 F (36.9 C), temperature source Oral, resp. rate 16, last menstrual period 06/15/2013.  Physical Exam  Constitutional: She is oriented to person, place, and time. She appears well-developed and well-nourished. No distress.  HENT:  Head: Normocephalic and atraumatic.  Eyes: Pupils are equal, round, and reactive to light.  Cardiovascular: Normal rate, regular rhythm and normal heart sounds.   Respiratory: Effort normal and breath sounds normal. No respiratory distress.  GI: Soft. Bowel sounds are normal. She exhibits no distension. There is no tenderness.  Genitourinary:  Not examined  Musculoskeletal: Normal range of motion.  Neurological: She is alert and oriented to person, place, and time. She has normal reflexes.  Skin: Skin is warm and dry.  Psychiatric: She has a normal mood and affect. Her behavior is normal. Judgment and thought content normal.   Results for orders placed during the hospital encounter of 10/16/13 (from the past 24 hour(s))  URINALYSIS, ROUTINE W REFLEX MICROSCOPIC     Status: Abnormal   Collection Time    10/16/13  8:35 AM      Result Value Range   Color, Urine ORANGE (*) YELLOW   APPearance CLEAR  CLEAR   Specific Gravity, Urine >  1.030 (*) 1.005 - 1.030   pH 6.0  5.0 - 8.0   Glucose, UA 100 (*) NEGATIVE mg/dL   Hgb urine dipstick TRACE (*) NEGATIVE   Bilirubin Urine MODERATE (*) NEGATIVE   Ketones, ur >80 (*) NEGATIVE mg/dL   Protein, ur 409 (*) NEGATIVE mg/dL   Urobilinogen, UA 4.0 (*) 0.0 - 1.0 mg/dL   Nitrite NEGATIVE  NEGATIVE   Leukocytes, UA TRACE (*) NEGATIVE  URINE MICROSCOPIC-ADD ON     Status: Abnormal   Collection Time    10/16/13  8:35 AM      Result Value Range   Squamous Epithelial / LPF FEW (*) RARE   WBC, UA 0-2  <3 WBC/hpf   RBC / HPF 3-6  <3 RBC/hpf   Bacteria, UA RARE  RARE   Urine-Other AMORPHOUS URATES/PHOSPHATES        MAU Course  Procedures  MDM IVF LR with 25 mg phenergan IVF with MVI Pepcid 20 mg IVPB Zofran 8 mg ODT IVF D5LR  Assessment and Plan   A: Hyperemesis gravidarum  P: Three liters IVF with above meds Pt states she has all medications at home Follow up with NOB care tomorrow  Carolynn Serve 10/16/2013, 9:14 AM

## 2013-10-18 NOTE — MAU Provider Note (Signed)
Attestation of Attending Supervision of Advanced Practitioner (CNM/NP): Evaluation and management procedures were performed by the Advanced Practitioner under my supervision and collaboration.  I have reviewed the Advanced Practitioner's note and chart, and I agree with the management and plan.  Aldean Pipe 10/18/2013 9:22 AM

## 2013-10-22 LAB — OB RESULTS CONSOLE GC/CHLAMYDIA
Chlamydia: NEGATIVE
GC PROBE AMP, GENITAL: NEGATIVE

## 2013-10-22 LAB — OB RESULTS CONSOLE ANTIBODY SCREEN: ANTIBODY SCREEN: NEGATIVE

## 2013-10-22 LAB — OB RESULTS CONSOLE HEPATITIS B SURFACE ANTIGEN: Hepatitis B Surface Ag: NEGATIVE

## 2013-10-22 LAB — OB RESULTS CONSOLE HIV ANTIBODY (ROUTINE TESTING): HIV: NONREACTIVE

## 2013-10-22 LAB — OB RESULTS CONSOLE ABO/RH: RH Type: POSITIVE

## 2013-10-22 LAB — OB RESULTS CONSOLE RPR: RPR: NONREACTIVE

## 2013-10-22 LAB — OB RESULTS CONSOLE RUBELLA ANTIBODY, IGM: Rubella: IMMUNE

## 2013-12-19 NOTE — L&D Delivery Note (Signed)
Delivery Note At 10:45 AM a viable female "Kristen Phillips" was delivered via VBAC, Spontaneous (Presentation: Compound w/right hand and arm, loose nuchal cord; Occiput Anterior).  APGAR: 9, 9; weight 4 lb 8.1 oz (2045 g).   Placenta status: Intact, Spontaneous Pathology.  Cord: 3 vessels with the following complications: None.  Cord pH: N/A  Anesthesia: Epidural  Episiotomy: None Lacerations: Bilateral Periurethral tear that was hemostatic. Patient requested for no repair. Suture Repair: N/A Est. Blood Loss (mL): 250  Mom to postpartum.  Baby to Couplet care / Skin to Skin.  Domique Clapper LYNN 02/28/2014, 11:27 AM

## 2014-02-27 ENCOUNTER — Inpatient Hospital Stay (HOSPITAL_COMMUNITY)
Admission: AD | Admit: 2014-02-27 | Discharge: 2014-02-27 | Disposition: A | Discharge status: Home / Self Care | Payer: Medicaid Other | Source: Ambulatory Visit | Attending: Obstetrics and Gynecology | Admitting: Obstetrics and Gynecology

## 2014-02-27 ENCOUNTER — Encounter (HOSPITAL_COMMUNITY): Payer: Self-pay | Admitting: *Deleted

## 2014-02-27 DIAGNOSIS — O0930 Supervision of pregnancy with insufficient antenatal care, unspecified trimester: Secondary | ICD-10-CM

## 2014-02-27 DIAGNOSIS — O34219 Maternal care for unspecified type scar from previous cesarean delivery: Secondary | ICD-10-CM | POA: Diagnosis present

## 2014-02-27 DIAGNOSIS — R1110 Vomiting, unspecified: Secondary | ICD-10-CM | POA: Diagnosis not present

## 2014-02-27 DIAGNOSIS — O4700 False labor before 37 completed weeks of gestation, unspecified trimester: Secondary | ICD-10-CM | POA: Insufficient documentation

## 2014-02-27 DIAGNOSIS — Z87891 Personal history of nicotine dependence: Secondary | ICD-10-CM | POA: Insufficient documentation

## 2014-02-27 DIAGNOSIS — R109 Unspecified abdominal pain: Secondary | ICD-10-CM | POA: Insufficient documentation

## 2014-02-27 LAB — URINALYSIS, ROUTINE W REFLEX MICROSCOPIC
Bilirubin Urine: NEGATIVE
GLUCOSE, UA: NEGATIVE mg/dL
KETONES UR: NEGATIVE mg/dL
LEUKOCYTES UA: NEGATIVE
Nitrite: NEGATIVE
PROTEIN: NEGATIVE mg/dL
Specific Gravity, Urine: 1.015 (ref 1.005–1.030)
UROBILINOGEN UA: 1 mg/dL (ref 0.0–1.0)
pH: 6 (ref 5.0–8.0)

## 2014-02-27 LAB — URINE MICROSCOPIC-ADD ON

## 2014-02-27 NOTE — MAU Note (Signed)
Pt C/O irregular uc's since yesterday, less than 10 minutes apart.  Denies bleeding or LOF.

## 2014-02-27 NOTE — Discharge Instructions (Signed)

## 2014-02-27 NOTE — MAU Provider Note (Signed)
History   28 yo G5P1031 at 2536 5/7 weeks presented unannounced c/o contractions during the night which kept her awake, but did not have a sense of how frequently they were occurring.  Also reports nausea, episode of loose stool.  Reports +FM, denies leaking or bleeding.    [facility]--cervix closed, 50%, vtx, -3, GBS done (still pending).  VBAC consent signed 02/12/14.  Recent US at 32 weeks for S<D--EFW 4+14, 18%, vtx, AFI WNL.  Previous US at 28 weeks--EFW 14%ile, AC 12%ile, vtx.    Patient Active Problem List   Diagnosis Date Noted  . Previous cesarean delivery, antepartum condition or complication 02/27/2014  . SGA (small for gestational age)--EFW at 18%ile at 32 weeks 02/27/2014  . Late prenatal care--at 18 weeks 02/27/2014  . Hyperemesis 02/27/2014  2005--Previous LTCS with single layer closure by Dr. Penne LashLeggett.  Induced for oligohydramnios and SGA, progressed to 4 cm, with NRFHR. TAB x 3  Chief Complaint  Patient presents with  . Contractions   HPI:  See above  OB History   Grav Para Term Preterm Abortions TAB SAB Ect Mult Living   5 1 1  3 3    1     2005--[facility] Dr. Penne [facility]ash[facility]gett, induced due to oligo and SGA, NRFHR at 4 cm, single layer closure. TAB x 3  Past Medical History  Diagnosis Date  . LKGMWNUU(725.3Headache(784.0)     Past Surgical History  Procedure Laterality Date  . Cesarean section    . Induced abortion      Family History  Problem Relation Age of Onset  . Diabetes Mother   . Diabetes Maternal Grandmother   . Diabetes Paternal Grandmother     History  Substance Use Topics  . Smoking status: Former Smoker -- 0.25 packs/day for 12 years    Types: Cigarettes    Quit date: 07/13/2013  . Smokeless tobacco: Not on file  . Alcohol Use: No    Allergies: No Known Allergies  Prescriptions prior to admission  Medication Sig Dispense Refill  . metoCLOPramide (REGLAN) 10 MG tablet Take 1 tablet (10 mg total) by mouth 4 (four) times  daily.  180 tablet  3  . promethazine (PHENERGAN) 25 MG suppository Place 1 suppository (25 mg total) rectally every 6 (six) hours as needed for nausea.  20 each  0  . ranitidine (ZANTAC) 150 MG tablet Take 150 mg by mouth daily as needed for heartburn.        ROS:  Cramping, +FM, mild nausea this am Physical Exam   Blood pressure 108/74, pulse 73, temperature 98.1 F (36.7 C), temperature source Oral, resp. rate 18, last menstrual period 06/15/2013.  Physical Exam Chest clear Heart RRR without murmur Abd gravid, NT, FH approx 34 cm Pelvic:  Cervix posterior, tight 1 cm, 80%, vtx, -2. Ext WNL  FHR--segment of accels noted on initial tracing, moderate variability UCs mild irritability, with more defined UCs q 10 minutes..  ED Course  IUP at 36 5/7 weeks Early vs prodromal labor GBS pending from 3/11 Previous C/S, desires VBAC  Plan: Observe x 1-2 hours for labor status. UA Reviewed concept of prodromal labor and need to allow that process to develop without intervention, particularly since patient wants a VBAC.   Nigel BridgemanLATHAM, Amarien Carne CNM, [place] 02/27/2014 9:48 AM  Addendum: Dozing at intervals. UCs very sporadic, at least q 10 min, mild. FHR Category 1, good FM.  Cervix unchanged--tight 1 cm, 80%, vtx, -1.  D/C'd home with labor precautions and FKCs discussed. To keep scheduled appt at [facility] next week, or call/return prn.  Nigel Bridgeman, CNM 02/27/14 10:20a.

## 2014-02-28 ENCOUNTER — Encounter (HOSPITAL_COMMUNITY): Payer: Self-pay

## 2014-02-28 ENCOUNTER — Inpatient Hospital Stay (HOSPITAL_COMMUNITY): Payer: Medicaid Other | Admitting: Anesthesiology

## 2014-02-28 ENCOUNTER — Encounter (HOSPITAL_COMMUNITY): Payer: Medicaid Other | Admitting: Anesthesiology

## 2014-02-28 ENCOUNTER — Inpatient Hospital Stay (HOSPITAL_COMMUNITY)
Admission: AD | Admit: 2014-02-28 | Discharge: 2014-03-02 | DRG: 775 | Disposition: A | Discharge status: Home / Self Care | Payer: Medicaid Other | Source: Ambulatory Visit | Attending: Obstetrics and Gynecology | Admitting: Obstetrics and Gynecology

## 2014-02-28 DIAGNOSIS — O328XX0 Maternal care for other malpresentation of fetus, not applicable or unspecified: Secondary | ICD-10-CM | POA: Diagnosis present

## 2014-02-28 DIAGNOSIS — O0930 Supervision of pregnancy with insufficient antenatal care, unspecified trimester: Secondary | ICD-10-CM

## 2014-02-28 DIAGNOSIS — O34219 Maternal care for unspecified type scar from previous cesarean delivery: Principal | ICD-10-CM | POA: Diagnosis present

## 2014-02-28 DIAGNOSIS — O365990 Maternal care for other known or suspected poor fetal growth, unspecified trimester, not applicable or unspecified: Secondary | ICD-10-CM | POA: Diagnosis present

## 2014-02-28 DIAGNOSIS — Z87891 Personal history of nicotine dependence: Secondary | ICD-10-CM

## 2014-02-28 LAB — CBC
HCT: 35.7 % — ABNORMAL LOW (ref 36.0–46.0)
Hemoglobin: 12.4 g/dL (ref 12.0–15.0)
MCH: 33.8 pg (ref 26.0–34.0)
MCHC: 34.7 g/dL (ref 30.0–36.0)
MCV: 97.3 fL (ref 78.0–100.0)
Platelets: 235 K/uL (ref 150–400)
RBC: 3.67 MIL/uL — ABNORMAL LOW (ref 3.87–5.11)
RDW: 13.4 % (ref 11.5–15.5)
WBC: 9.4 K/uL (ref 4.0–10.5)

## 2014-02-28 LAB — ABO/RH: ABO/RH(D): O POS

## 2014-02-28 LAB — TYPE AND SCREEN
ABO/RH(D): O POS
Antibody Screen: NEGATIVE

## 2014-02-28 LAB — RPR: RPR: NONREACTIVE

## 2014-02-28 MED ORDER — FENTANYL 2.5 MCG/ML BUPIVACAINE 1/10 % EPIDURAL INFUSION (WH - ANES): 14.0000 mL/h | INTRAMUSCULAR | Status: DC | PRN

## 2014-02-28 MED ORDER — ONDANSETRON HCL 4 MG/2ML IJ SOLN: 4.0000 mg | INTRAMUSCULAR | Status: DC | PRN

## 2014-02-28 MED ORDER — IBUPROFEN 600 MG PO TABS
600.0000 mg | ORAL_TABLET | Freq: Four times a day (QID) | ORAL | Status: DC
  Administered 2014-02-28 – 2014-03-02 (×8): 600 mg via ORAL
  Filled 2014-02-28 (×8): qty 1

## 2014-02-28 MED ORDER — TETANUS-DIPHTH-ACELL PERTUSSIS 5-2.5-18.5 LF-MCG/0.5 IM SUSP: 0.5000 mL | Freq: Once | INTRAMUSCULAR | Status: DC

## 2014-02-28 MED ORDER — PRENATAL MULTIVITAMIN CH
1.0000 | ORAL_TABLET | Freq: Every day | ORAL | Status: DC
  Administered 2014-03-01: 1 via ORAL
  Filled 2014-02-28: qty 1

## 2014-02-28 MED ORDER — ACETAMINOPHEN 325 MG PO TABS: 650.0000 mg | ORAL_TABLET | ORAL | Status: DC | PRN

## 2014-02-28 MED ORDER — ONDANSETRON HCL 4 MG PO TABS: 4.0000 mg | ORAL_TABLET | ORAL | Status: DC | PRN

## 2014-02-28 MED ORDER — PHENYLEPHRINE 40 MCG/ML (10ML) SYRINGE FOR IV PUSH (FOR BLOOD PRESSURE SUPPORT)
PREFILLED_SYRINGE | INTRAVENOUS | Status: AC
  Filled 2014-02-28: qty 10

## 2014-02-28 MED ORDER — FENTANYL 2.5 MCG/ML BUPIVACAINE 1/10 % EPIDURAL INFUSION (WH - ANES)
INTRAMUSCULAR | Status: AC
  Filled 2014-02-28: qty 125

## 2014-02-28 MED ORDER — EPHEDRINE 5 MG/ML INJ
10.0000 mg | INTRAVENOUS | Status: DC | PRN
  Filled 2014-02-28: qty 2

## 2014-02-28 MED ORDER — FLEET ENEMA 7-19 GM/118ML RE ENEM: 1.0000 | ENEMA | RECTAL | Status: DC | PRN

## 2014-02-28 MED ORDER — BENZOCAINE-MENTHOL 20-0.5 % EX AERO
1.0000 "application " | INHALATION_SPRAY | CUTANEOUS | Status: DC | PRN
  Administered 2014-02-28: 1 via TOPICAL
  Filled 2014-02-28: qty 56

## 2014-02-28 MED ORDER — SENNOSIDES-DOCUSATE SODIUM 8.6-50 MG PO TABS
2.0000 | ORAL_TABLET | ORAL | Status: DC
  Administered 2014-02-28 – 2014-03-01 (×2): 2 via ORAL
  Filled 2014-02-28 (×2): qty 2

## 2014-02-28 MED ORDER — DIPHENHYDRAMINE HCL 50 MG/ML IJ SOLN: 12.5000 mg | INTRAMUSCULAR | Status: DC | PRN

## 2014-02-28 MED ORDER — LIDOCAINE HCL (PF) 1 % IJ SOLN
INTRAMUSCULAR | Status: DC | PRN
  Administered 2014-02-28 (×2): 5 mL

## 2014-02-28 MED ORDER — DEXTROSE 5 % IV SOLN
5.0000 10*6.[IU] | Freq: Once | INTRAVENOUS | Status: AC
  Administered 2014-02-28: 5 10*6.[IU] via INTRAVENOUS
  Filled 2014-02-28: qty 5

## 2014-02-28 MED ORDER — LIDOCAINE HCL (PF) 1 % IJ SOLN
30.0000 mL | INTRAMUSCULAR | Status: DC | PRN
  Administered 2014-02-28: 30 mL via SUBCUTANEOUS
  Filled 2014-02-28: qty 30

## 2014-02-28 MED ORDER — LACTATED RINGERS IV BOLUS (SEPSIS)
500.0000 mL | Freq: Once | INTRAVENOUS | Status: AC
  Administered 2014-02-28: 500 mL via INTRAVENOUS

## 2014-02-28 MED ORDER — IBUPROFEN 600 MG PO TABS
600.0000 mg | ORAL_TABLET | Freq: Four times a day (QID) | ORAL | Status: DC | PRN
  Administered 2014-02-28: 600 mg via ORAL
  Filled 2014-02-28: qty 1

## 2014-02-28 MED ORDER — OXYTOCIN 40 UNITS IN LACTATED RINGERS INFUSION - SIMPLE MED
62.5000 mL/h | INTRAVENOUS | Status: DC
  Filled 2014-02-28: qty 1000

## 2014-02-28 MED ORDER — OXYCODONE-ACETAMINOPHEN 5-325 MG PO TABS
1.0000 | ORAL_TABLET | ORAL | Status: DC | PRN
  Administered 2014-02-28 – 2014-03-02 (×4): 1 via ORAL
  Filled 2014-02-28 (×4): qty 1

## 2014-02-28 MED ORDER — SIMETHICONE 80 MG PO CHEW: 80.0000 mg | CHEWABLE_TABLET | ORAL | Status: DC | PRN

## 2014-02-28 MED ORDER — BUTORPHANOL TARTRATE 1 MG/ML IJ SOLN
1.0000 mg | Freq: Once | INTRAMUSCULAR | Status: AC
  Administered 2014-02-28: 1 mg via INTRAVENOUS
  Filled 2014-02-28: qty 1

## 2014-02-28 MED ORDER — LACTATED RINGERS IV SOLN
500.0000 mL | Freq: Once | INTRAVENOUS | Status: AC
  Administered 2014-02-28: 10:00:00 via INTRAVENOUS

## 2014-02-28 MED ORDER — ZOLPIDEM TARTRATE 5 MG PO TABS
5.0000 mg | ORAL_TABLET | Freq: Every evening | ORAL | Status: DC | PRN
Start: 2014-02-28 — End: 2014-03-02

## 2014-02-28 MED ORDER — LACTATED RINGERS IV SOLN: 500.0000 mL | INTRAVENOUS | Status: DC | PRN

## 2014-02-28 MED ORDER — ONDANSETRON HCL 4 MG/2ML IJ SOLN: 4.0000 mg | Freq: Four times a day (QID) | INTRAMUSCULAR | Status: DC | PRN

## 2014-02-28 MED ORDER — PHENYLEPHRINE 40 MCG/ML (10ML) SYRINGE FOR IV PUSH (FOR BLOOD PRESSURE SUPPORT)
80.0000 ug | PREFILLED_SYRINGE | INTRAVENOUS | Status: DC | PRN
  Filled 2014-02-28: qty 2

## 2014-02-28 MED ORDER — FENTANYL 2.5 MCG/ML BUPIVACAINE 1/10 % EPIDURAL INFUSION (WH - ANES)
INTRAMUSCULAR | Status: DC | PRN
  Administered 2014-02-28: 14 mL/h via EPIDURAL

## 2014-02-28 MED ORDER — DIBUCAINE 1 % RE OINT: 1.0000 "application " | TOPICAL_OINTMENT | RECTAL | Status: DC | PRN

## 2014-02-28 MED ORDER — OXYCODONE-ACETAMINOPHEN 5-325 MG PO TABS
1.0000 | ORAL_TABLET | ORAL | Status: DC | PRN
  Administered 2014-02-28: 1 via ORAL
  Filled 2014-02-28: qty 1

## 2014-02-28 MED ORDER — WITCH HAZEL-GLYCERIN EX PADS: 1.0000 "application " | MEDICATED_PAD | CUTANEOUS | Status: DC | PRN

## 2014-02-28 MED ORDER — OXYTOCIN BOLUS FROM INFUSION: 500.0000 mL | INTRAVENOUS | Status: DC

## 2014-02-28 MED ORDER — LACTATED RINGERS IV SOLN: INTRAVENOUS | Status: DC

## 2014-02-28 MED ORDER — LANOLIN HYDROUS EX OINT: TOPICAL_OINTMENT | CUTANEOUS | Status: DC | PRN

## 2014-02-28 MED ORDER — EPHEDRINE 5 MG/ML INJ
INTRAVENOUS | Status: AC
  Filled 2014-02-28: qty 4

## 2014-02-28 MED ORDER — DIPHENHYDRAMINE HCL 25 MG PO CAPS: 25.0000 mg | ORAL_CAPSULE | Freq: Four times a day (QID) | ORAL | Status: DC | PRN

## 2014-02-28 MED ORDER — PROMETHAZINE HCL 25 MG/ML IJ SOLN
25.0000 mg | Freq: Once | INTRAMUSCULAR | Status: AC
  Administered 2014-02-28: 25 mg via INTRAVENOUS
  Filled 2014-02-28: qty 1

## 2014-02-28 MED ORDER — CITRIC ACID-SODIUM CITRATE 334-500 MG/5ML PO SOLN: 30.0000 mL | ORAL | Status: DC | PRN

## 2014-02-28 MED ORDER — DEXTROSE 5 % IV SOLN
2.5000 10*6.[IU] | INTRAVENOUS | Status: DC
  Filled 2014-02-28 (×2): qty 2.5

## 2014-02-28 NOTE — H&P (Signed)
Kristen Phillips is a 28 y.o. female, G5P1031 at 36.6 weeks, presenting for active labor desiring VBAC.  Patient Active Problem List   Diagnosis Date Noted  . Previous cesarean delivery, antepartum condition or complication 02/27/2014  . SGA (small for gestational age)--EFW at 18%ile at 32 weeks 02/27/2014  . Late prenatal care--at 18 weeks 02/27/2014  . Hyperemesis 02/27/2014    History of present pregnancy: Patient entered care at 18.5 weeks.   EDC of 03/22/2014 was established by Definite LMP.   Anatomy scan:  20.3 weeks, with normal findings and an anterior placenta.   Additional US evaluations:  Growth for H/O SGA at 28wk (14th%ile) 30wks (15th%ile) 34wks(18th%ile) Significant prenatal events:  None   Last evaluation:  February 26, 2014  OB History   Grav Para Term Preterm Abortions TAB SAB Ect Mult Living   5 1 1  3 3    1      Past Medical History  Diagnosis Date  . WJXBJYNW(295.6Headache(784.0)    Past Surgical History  Procedure Laterality Date  . Cesarean section    . Induced abortion     Family History: family history includes Diabetes in her maternal grandmother, mother, and paternal grandmother. Social History:  reports that she quit smoking about 7 months ago. Her smoking use included Cigarettes. She has a 3 pack-year smoking history. She does not have any smokeless tobacco history on file. She reports that she does not drink alcohol or use illicit drugs.   Prenatal Transfer Tool  Maternal Diabetes: No Genetic Screening: Normal Maternal Ultrasounds/Referrals: Normal Fetal Ultrasounds or other Referrals:  Other: Growth Q4 for H/O SGA Maternal Substance Abuse:  No Significant Maternal Medications:  None Significant Maternal Lab Results: Lab values include: Group B Strep negative  ROS:  Contractions.  Denies LOF and VB.  Reports active fetus  No Known Allergies   Dilation: 5 Effacement (%): 100 Station: -1 Exam by:: V. Latham Blood pressure 100/53, pulse 90,  temperature 98.4 F (36.9 C), temperature source Oral, resp. rate 18, height 5\' 2"  (1.575 m), weight 149 lb (67.586 kg), last menstrual period 06/15/2013, SpO2 100.00%.  Chest clear Heart RRR without murmur Abd gravid, NT Ext: WNL  FHR: Cat 1 UCs:  Q 9-2610min  Prenatal labs: ABO, Rh: O/Positive/-- (11/04 0000) Antibody: Negative (11/04 0000) Rubella:   Immune RPR: Nonreactive (11/04 0000)  HBsAg: Negative (11/04 0000)  HIV: Non-reactive (11/04 0000)  GBS:  Unknown Sickle cell/Hgb electrophoresis:   Pap: 07/2012 WNL GC:  Neg Chlamydia:  Neg  Genetic screenings:  AFP-WNL Glucola:  117 Other:     Assessment/Plan: IUP at 36.6wks VBAC GBS Unknown   [facility]: [facility] consult with Dr. Lance MorinA. Roberts Routine Labor and Delivery Orders Start PCN for GBS prophylaxis Okay for Epidural Anticipate SVD   Kristen Phillips LYNNCNM, [place] 02/28/2014, 8:55 AM

## 2014-02-28 NOTE — Anesthesia Procedure Notes (Signed)
Epidural Patient location during procedure: OB Start time: 02/28/2014 10:20 AM  Staffing Anesthesiologist: Brayton CavesJACKSON, Nayzeth Altman Performed by: anesthesiologist   Preanesthetic Checklist Completed: patient identified, site marked, surgical consent, pre-op evaluation, timeout performed, IV checked, risks and benefits discussed and monitors and equipment checked  Epidural Patient position: sitting Prep: site prepped and draped and DuraPrep Patient monitoring: continuous pulse ox and blood pressure Approach: midline Injection technique: LOR air  Needle:  Needle type: Tuohy  Needle gauge: 17 G Needle length: 9 cm and 9 Needle insertion depth: 5 cm cm Catheter type: closed end flexible Catheter size: 19 Gauge Catheter at skin depth: 10 cm Test dose: negative  Assessment Events: blood not aspirated, injection not painful, no injection resistance, negative IV test and no paresthesia  Additional Notes Patient identified.  Risk benefits discussed including failed block, incomplete pain control, headache, nerve damage, paralysis, blood pressure changes, nausea, vomiting, reactions to medication both toxic or allergic, and postpartum back pain.  Patient expressed understanding and wished to proceed.  All questions were answered.  Sterile technique used throughout procedure and epidural site dressed with sterile barrier dressing. No paresthesia or other complications noted.The patient did not experience any signs of intravascular injection such as tinnitus or metallic taste in mouth nor signs of intrathecal spread such as rapid motor block. Please see nursing notes for vital signs.

## 2014-02-28 NOTE — Anesthesia Preprocedure Evaluation (Signed)

## 2014-02-28 NOTE — MAU Note (Signed)
PT SAYS SHE WAS HERE  EARLIER VE 1 CM.  DENIES SROM, HSV.  WAS TESTED FOR GBS- 2 DAYS AGO. HAS HX MRSA.

## 2014-02-28 NOTE — Anesthesia Postprocedure Evaluation (Signed)
  Anesthesia Post-op Note  Patient: Kristen Phillips  Procedure(s) Performed: * No procedures listed *  Patient Location: Mother/Baby  Anesthesia Type:Epidural  Level of Consciousness: awake  Airway and Oxygen Therapy: Patient Spontanous Breathing  Post-op Pain: mild  Post-op Assessment: Patient's Cardiovascular Status Stable and Respiratory Function Stable  Post-op Vital Signs: stable  Complications: No apparent anesthesia complications

## 2014-03-01 LAB — CBC
HCT: 31.3 % — ABNORMAL LOW (ref 36.0–46.0)
Hemoglobin: 10.6 g/dL — ABNORMAL LOW (ref 12.0–15.0)
MCH: 33 pg (ref 26.0–34.0)
MCHC: 33.9 g/dL (ref 30.0–36.0)
MCV: 97.5 fL (ref 78.0–100.0)
Platelets: 222 10*3/uL (ref 150–400)
RBC: 3.21 MIL/uL — ABNORMAL LOW (ref 3.87–5.11)
RDW: 13.7 % (ref 11.5–15.5)
WBC: 14.6 10*3/uL — AB (ref 4.0–10.5)

## 2014-03-01 NOTE — Progress Notes (Signed)
Post Partum Day 1:S/P SVD Subjective: Patient up ad lib, denies syncope or dizziness. Feeding:  Breast Contraceptive plan:   OCPs  Objective: Blood pressure 99/62, pulse 80, temperature 98.7 F (37.1 C), temperature source Oral, resp. rate 18, height 5\' 2"  (1.575 m), weight 149 lb (67.586 kg), last menstrual period 06/15/2013, SpO2 100.00%, unknown if currently breastfeeding.  Physical Exam:  General: alert and no distress Lochia: appropriate Uterine Fundus: firm Incision: N/A DVT Evaluation: No evidence of DVT seen on physical exam. Negative Homan's sign. No significant calf/ankle edema.   Recent Labs  02/28/14 0920 03/01/14 0559  HGB 12.4 10.6*  HCT 35.7* 31.3*    Assessment/Plan: S/P Vaginal delivery day 1 Desires OCP-discussed POPs due to breastfeeding Breastfeeding going well Continue current care Plan for discharge tomorrow   LOS: 1 day   Jerriah Ines LYNN 03/01/2014, 6:17 PM

## 2014-03-02 MED ORDER — NORETHINDRONE 0.35 MG PO TABS: 1.0000 | ORAL_TABLET | Freq: Every day | ORAL | Status: DC

## 2014-03-02 MED ORDER — IBUPROFEN 600 MG PO TABS: 600.0000 mg | ORAL_TABLET | Freq: Four times a day (QID) | ORAL | Status: DC

## 2014-03-02 MED ORDER — OXYCODONE-ACETAMINOPHEN 5-325 MG PO TABS: 1.0000 | ORAL_TABLET | ORAL | Status: DC | PRN

## 2014-03-02 NOTE — Discharge Summary (Signed)
Vaginal Delivery Discharge Summary  Kristen Phillips  DOB:    1986-08-26 MRN:    [id number] CSN:    [id number]  Date of admission:                  02/28/2014  Date of discharge:                   03/02/2014  Procedures this admission: VBAC- with Bilateral Periuretheral tears, not repaired per patient request  Date of Delivery: 02/28/2014  Newborn Data:  Live born female  Birth Weight: 4 lb 8.1 oz (2045 g) APGAR: 9, 9  Home with mother. Name: Kristen Phillips Circumcision Plan: None  History of Present Illness:  [name] is a 28 y.o. female, [phone], who presents at 7938w6d weeks gestation. [facility] She was admitted onset of labor. Her pregnancy has been complicated by:  Patient Active Problem List   Diagnosis Date Noted  . VBAC, delivered 02/28/2014  . SGA (small for gestational age)--EFW at 18%ile at 32 weeks 02/27/2014    Hospital course:  The patient was admitted for labor.   Her labor was not complicated. She proceeded to have a vaginal delivery after cesarean of a healthy infant. Her delivery was not complicated. Her postpartum course was not complicated. She was discharged to home on postpartum day 2 doing well.  Feeding:  breast and bottle  Contraception:  oral progesterone-only contraceptive  Discharge hemoglobin:  Hemoglobin  Date Value Ref Range Status  03/01/2014 10.6* 12.0 - 15.0 g/dL Final     HCT  Date Value Ref Range Status  03/01/2014 31.3* 36.0 - 46.0 % Final    Discharge Physical Exam:   General: alert and no distress Lochia: appropriate Uterine Fundus: firm Incision: N/A DVT Evaluation: No evidence of DVT seen on physical exam. Negative Homan's sign. No significant calf/ankle edema.  Intrapartum Procedures: VBAC Postpartum Procedures: none Complications-Operative and Postpartum: none  Discharge Diagnoses: VBAC,  delivered  Discharge Information:  Activity:           pelvic rest Diet:                routine Medications: Ibuprofen, Percocet and micronor rx Condition:      stable Instructions:   Postpartum Care After Vaginal Delivery  After you deliver your newborn (postpartum period), the usual stay in the hospital is 24 72 hours. If there were problems with your labor or delivery, or if you have other medical problems, you might be in the hospital longer.  While you are in the hospital, you will receive help and instructions on how to care for yourself and your newborn during the postpartum period.  While you are in the hospital:  Be sure to tell your nurses if you have pain or discomfort, as well as where you feel the pain and what makes the pain worse.  If you had an incision made near your vagina (episiotomy) or if you had some tearing during delivery, the nurses may put ice packs on your episiotomy or tear. The ice packs may help to reduce the pain and swelling.  If you are breastfeeding, you may feel uncomfortable contractions of your uterus for a couple of weeks. This is normal. The contractions help your uterus get back to normal size.  It is normal to have some bleeding after delivery.  For the first 1 3 days after  delivery, the flow is red and the amount may be similar to a period.  It is common for the flow to start and stop.  In the first few days, you may pass some small clots. Let your nurses know if you begin to pass large clots or your flow increases.  Do not  flush blood clots down the toilet before having the nurse look at them.  During the next 3 10 days after delivery, your flow should become more watery and pink or brown-tinged in color.  Ten to fourteen days after delivery, your flow should be a small amount of yellowish-white discharge.  The amount of your flow will decrease over the first few weeks after delivery. Your flow may stop in 6 8 weeks. Most women have had  their flow stop by 12 weeks after delivery.  You should change your sanitary pads frequently.  Wash your hands thoroughly with soap and water for at least 20 seconds after changing pads, using the toilet, or before holding or feeding your newborn.  You should feel like you need to empty your bladder within the first 6 8 hours after delivery.  In case you become weak, lightheaded, or faint, call your nurse before you get out of bed for the first time and before you take a shower for the first time.  Within the first few days after delivery, your breasts may begin to feel tender and full. This is called engorgement. Breast tenderness usually goes away within 48 72 hours after engorgement occurs. You may also notice milk leaking from your breasts. If you are not breastfeeding, do not stimulate your breasts. Breast stimulation can make your breasts produce more milk.  Spending as much time as possible with your newborn is very important. During this time, you and your newborn can feel close and get to know each other. Having your newborn stay in your room (rooming in) will help to strengthen the bond with your newborn. It will give you time to get to know your newborn and become comfortable caring for your newborn.  Your hormones change after delivery. Sometimes the hormone changes can temporarily cause you to feel sad or tearful. These feelings should not last more than a few days. If these feelings last longer than that, you should talk to your caregiver.  If desired, talk to your caregiver about methods of family planning or contraception.  Talk to your caregiver about immunizations. Your caregiver may want you to have the following immunizations before leaving the hospital:  Tetanus, diphtheria, and pertussis (Tdap) or tetanus and diphtheria (Td) immunization. It is very important that you and your family (including grandparents) or others caring for your newborn are up-to-date with the Tdap or  Td immunizations. The Tdap or Td immunization can help protect your newborn from getting ill.  Rubella immunization.  Varicella (chickenpox) immunization.  Influenza immunization. You should receive this annual immunization if you did not receive the immunization during your pregnancy. Document Released: 10/02/2007 Document Revised: 08/29/2012 Document Reviewed: 08/01/2012 [facility] Patient Information 2014 [place], [place].   Postpartum Depression and Baby Blues  The postpartum period begins right after the birth of a baby. During this time, there is often a great amount of joy and excitement. It is also a time of considerable changes in the life of the parent(s). Regardless of how many times a mother gives birth, each child brings new challenges and dynamics to the family. It is not unusual to have feelings of excitement accompanied by  confusing shifts in moods, emotions, and thoughts. All mothers are at risk of developing postpartum depression or the "baby blues." These mood changes can occur right after giving birth, or they may occur many months after giving birth. The baby blues or postpartum depression can be mild or severe. Additionally, postpartum depression can resolve rather quickly, or it can be a long-term condition. CAUSES Elevated hormones and their rapid decline are thought to be a main cause of postpartum depression and the baby blues. There are a number of hormones that radically change during and after pregnancy. Estrogen and progesterone usually decrease immediately after delivering your baby. The level of thyroid hormone and various cortisol steroids also rapidly drop. Other factors that play a major role in these changes include major life events and genetics.  RISK FACTORS If you have any of the following risks for the baby blues or postpartum depression, know what symptoms to watch out for during the postpartum period. Risk factors that may increase the likelihood of getting  the baby blues or postpartum depression include:  Havinga personal or family history of depression.  Having depression while being pregnant.  Having premenstrual or oral contraceptive-associated mood issues.  Having exceptional life stress.  Having marital conflict.  Lacking a social support network.  Having a baby with special needs.  Having health problems such as diabetes. SYMPTOMS Baby blues symptoms include:  Brief fluctuations in mood, such as going from extreme happiness to sadness.  Decreased concentration.  Difficulty sleeping.  Crying spells, tearfulness.  Irritability.  Anxiety. Postpartum depression symptoms typically begin within the first month after giving birth. These symptoms include:  Difficulty sleeping or excessive sleepiness.  Marked weight loss.  Agitation.  Feelings of worthlessness.  Lack of interest in activity or food. Postpartum psychosis is a very concerning condition and can be dangerous. Fortunately, it is rare. Displaying any of the following symptoms is cause for immediate medical attention. Postpartum psychosis symptoms include:  Hallucinations and delusions.  Bizarre or disorganized behavior.  Confusion or disorientation. DIAGNOSIS  A diagnosis is made by an evaluation of your symptoms. There are no medical or lab tests that lead to a diagnosis, but there are various questionnaires that a caregiver may use to identify those with the baby blues, postpartum depression, or psychosis. Often times, a screening tool called the [place] Postnatal Depression Scale is used to diagnose depression in the postpartum period.  TREATMENT The baby blues usually goes away on its own in 1 to 2 weeks. Social support is often all that is needed. You should be encouraged to get adequate sleep and rest. Occasionally, you may be given medicines to help you sleep.  Postpartum depression requires treatment as it can last several months or longer if it  is not treated. Treatment may include individual or group therapy, medicine, or both to address any social, physiological, and psychological factors that may play a role in the depression. Regular exercise, a healthy diet, rest, and social support may also be strongly recommended.  Postpartum psychosis is more serious and needs treatment right away. Hospitalization is often needed. HOME CARE INSTRUCTIONS  Get as much rest as you can. Nap when the baby sleeps.  Exercise regularly. Some women find yoga and walking to be beneficial.  Eat a balanced and nourishing diet.  Do little things that you enjoy. Have a cup of tea, take a bubble bath, read your favorite magazine, or listen to your favorite music.  Avoid alcohol.  Ask for help with household  chores, cooking, grocery shopping, or running errands as needed. Do not try to do everything.  Talk to people close to you about how you are feeling. Get support from your partner, family members, friends, or other new moms.  Try to stay positive in how you think. Think about the things you are grateful for.  Do not spend a lot of time alone.  Only take medicine as directed by your caregiver.  Keep all your postpartum appointments.  Let your caregiver know if you have any concerns. SEEK MEDICAL CARE IF: You are having a reaction or problems with your medicine. SEEK IMMEDIATE MEDICAL CARE IF:  You have suicidal feelings.  You feel you may harm the baby or someone else. Document Released: 09/08/2004 Document Revised: 02/27/2012 Document Reviewed: 10/11/2011 [facility] Patient Information 2014 [place], [place].   Discharge to: home  Follow-up Information   Follow up with [facility]. Schedule an appointment as soon as possible for a visit in 6 weeks. (Call if you have any questions or concerns)    Specialty:  Obstetrics and Gynecology   Contact information:   [address] [place]  [address] [phone]       Nigel Bridgeman 03/02/2014

## 2014-03-02 NOTE — Discharge Instructions (Signed)
Postpartum Care After Vaginal Delivery °After you deliver your newborn (postpartum period), the usual stay in the hospital is 24 72 hours. If there were problems with your labor or delivery, or if you have other medical problems, you might be in the hospital longer.  °While you are in the hospital, you will receive help and instructions on how to care for yourself and your newborn during the postpartum period.  °While you are in the hospital: °· Be sure to tell your nurses if you have pain or discomfort, as well as where you feel the pain and what makes the pain worse. °· If you had an incision made near your vagina (episiotomy) or if you had some tearing during delivery, the nurses may put ice packs on your episiotomy or tear. The ice packs may help to reduce the pain and swelling. °· If you are breastfeeding, you may feel uncomfortable contractions of your uterus for a couple of weeks. This is normal. The contractions help your uterus get back to normal size. °· It is normal to have some bleeding after delivery. °· For the first 1 3 days after delivery, the flow is red and the amount may be similar to a period. °· It is common for the flow to start and stop. °· In the first few days, you may pass some small clots. Let your nurses know if you begin to pass large clots or your flow increases. °· Do not  flush blood clots down the toilet before having the nurse look at them. °· During the next 3 10 days after delivery, your flow should become more watery and pink or brown-tinged in color. °· Ten to fourteen days after delivery, your flow should be a small amount of yellowish-white discharge. °· The amount of your flow will decrease over the first few weeks after delivery. Your flow may stop in 6 8 weeks. Most women have had their flow stop by 12 weeks after delivery. °· You should change your sanitary pads frequently. °· Wash your hands thoroughly with soap and water for at least 20 seconds after changing pads, using  the toilet, or before holding or feeding your newborn. °· You should feel like you need to empty your bladder within the first 6 8 hours after delivery. °· In case you become weak, lightheaded, or faint, call your nurse before you get out of bed for the first time and before you take a shower for the first time. °· Within the first few days after delivery, your breasts may begin to feel tender and full. This is called engorgement. Breast tenderness usually goes away within 48 72 hours after engorgement occurs. You may also notice milk leaking from your breasts. If you are not breastfeeding, do not stimulate your breasts. Breast stimulation can make your breasts produce more milk. °· Spending as much time as possible with your newborn is very important. During this time, you and your newborn can feel close and get to know each other. Having your newborn stay in your room (rooming in) will help to strengthen the bond with your newborn.  It will give you time to get to know your newborn and become comfortable caring for your newborn. °· Your hormones change after delivery. Sometimes the hormone changes can temporarily cause you to feel sad or tearful. These feelings should not last more than a few days. If these feelings last longer than that, you should talk to your caregiver. °· If desired, talk to your caregiver about   methods of family planning or contraception.  Talk to your caregiver about immunizations. Your caregiver may want you to have the following immunizations before leaving the hospital:  Tetanus, diphtheria, and pertussis (Tdap) or tetanus and diphtheria (Td) immunization. It is very important that you and your family (including grandparents) or others caring for your newborn are up-to-date with the Tdap or Td immunizations. The Tdap or Td immunization can help protect your newborn from getting ill.  Rubella immunization.  Varicella (chickenpox) immunization.  Influenza immunization. You should  receive this annual immunization if you did not receive the immunization during your pregnancy. Document Released: 10/02/2007 Document Revised: 08/29/2012 Document Reviewed: 08/01/2012 [facility] Patient Information 2014 [place].  Postpartum Depression and Baby Blues The postpartum period begins right after the birth of a baby. During this time, there is often a great amount of joy and excitement. It is also a time of considerable changes in the life of the parent(s). Regardless of how many times a mother gives birth, each child brings new challenges and dynamics to the family. It is not unusual to have feelings of excitement accompanied by confusing shifts in moods, emotions, and thoughts. All mothers are at risk of developing postpartum depression or the "baby blues." These mood changes can occur right after giving birth, or they may occur many months after giving birth. The baby blues or postpartum depression can be mild or severe. Additionally, postpartum depression can resolve rather quickly, or it can be a long-term condition. CAUSES Elevated hormones and their rapid decline are thought to be a main cause of postpartum depression and the baby blues. There are a number of hormones that radically change during and after pregnancy. Estrogen and progesterone usually decrease immediately after delivering your baby. The level of thyroid hormone and various cortisol steroids also rapidly drop. Other factors that play a major role in these changes include major life events and genetics.  RISK FACTORS If you have any of the following risks for the baby blues or postpartum depression, know what symptoms to watch out for during the postpartum period. Risk factors that may increase the likelihood of getting the baby blues or postpartum depression include:  Havinga personal or family history of depression.  Having depression while being pregnant.  Having premenstrual or oral contraceptive-associated  mood issues.  Having exceptional life stress.  Having marital conflict.  Lacking a social support network.  Having a baby with special needs.  Having health problems such as diabetes. SYMPTOMS Baby blues symptoms include:  Brief fluctuations in mood, such as going from extreme happiness to sadness.  Decreased concentration.  Difficulty sleeping.  Crying spells, tearfulness.  Irritability.  Anxiety. Postpartum depression symptoms typically begin within the first month after giving birth. These symptoms include:  Difficulty sleeping or excessive sleepiness.  Marked weight loss.  Agitation.  Feelings of worthlessness.  Lack of interest in activity or food. Postpartum psychosis is a very concerning condition and can be dangerous. Fortunately, it is rare. Displaying any of the following symptoms is cause for immediate medical attention. Postpartum psychosis symptoms include:  Hallucinations and delusions.  Bizarre or disorganized behavior.  Confusion or disorientation. DIAGNOSIS  A diagnosis is made by an evaluation of your symptoms. There are no medical or lab tests that lead to a diagnosis, but there are various questionnaires that a caregiver may use to identify those with the baby blues, postpartum depression, or psychosis. Often times, a screening tool called the [place] Postnatal Depression Scale is used to diagnose  depression in the postpartum period.  TREATMENT The baby blues usually goes away on its own in 1 to 2 weeks. Social support is often all that is needed. You should be encouraged to get adequate sleep and rest. Occasionally, you may be given medicines to help you sleep.  Postpartum depression requires treatment as it can last several months or longer if it is not treated. Treatment may include individual or group therapy, medicine, or both to address any social, physiological, and psychological factors that may play a role in the depression. Regular  exercise, a healthy diet, rest, and social support may also be strongly recommended.  Postpartum psychosis is more serious and needs treatment right away. Hospitalization is often needed. HOME CARE INSTRUCTIONS  Get as much rest as you can. Nap when the baby sleeps.  Exercise regularly. Some women find yoga and walking to be beneficial.  Eat a balanced and nourishing diet.  Do little things that you enjoy. Have a cup of tea, take a bubble bath, read your favorite magazine, or listen to your favorite music.  Avoid alcohol.  Ask for help with household chores, cooking, grocery shopping, or running errands as needed. Do not try to do everything.  Talk to people close to you about how you are feeling. Get support from your partner, family members, friends, or other new moms.  Try to stay positive in how you think. Think about the things you are grateful for.  Do not spend a lot of time alone.  Only take medicine as directed by your caregiver.  Keep all your postpartum appointments.  Let your caregiver know if you have any concerns. SEEK MEDICAL CARE IF: You are having a reaction or problems with your medicine. SEEK IMMEDIATE MEDICAL CARE IF:  You have suicidal feelings.  You feel you may harm the baby or someone else. Document Released: 09/08/2004 Document Revised: 02/27/2012 Document Reviewed: 09/16/2013 [facility] Patient Information 2014 [place], [place].  Norethindrone tablets (contraception) What is this medicine? NORETHINDRONE (nor eth IN drone) is an oral contraceptive. The product contains a female hormone known as a progestin. It is used to prevent pregnancy. This medicine may be used for other purposes; ask your health care provider or pharmacist if you have questions. COMMON BRAND NAME(S): Camila, [place] , [place], [place], [place] , [place], Nor-QD, Nora-BE, Ortho Micronor What should I tell my health care provider before I take this medicine? They need to know if you  have any of these conditions: -blood vessel disease or blood clots -breast, cervical, or vaginal cancer -diabetes -heart disease -kidney disease -liver disease -mental depression -migraine -seizures -stroke -vaginal bleeding -an unusual or allergic reaction to norethindrone, other medicines, foods, dyes, or preservatives -pregnant or trying to get pregnant -breast-feeding How should I use this medicine? Take this medicine by mouth with a glass of water. You may take it with or without food. Follow the directions on the prescription label. Take this medicine at the same time each day and in the order directed on the package. Do not take your medicine more often than directed. Contact your pediatrician regarding the use of this medicine in children. Special care may be needed. This medicine has been used in female children who have started having menstrual periods. A patient package insert for the product will be given with each prescription and refill. Read this sheet carefully each time. The sheet may change frequently. Overdosage: If you think you have taken too much of this medicine contact a poison control center  or emergency room at once. NOTE: This medicine is only for you. Do not share this medicine with others. What if I miss a dose? Try not to miss a dose. Every time you miss a dose or take a dose late your chance of pregnancy increases. When 1 pill is missed (even if only 3 hours late), take the missed pill as soon as possible and continue taking a pill each day at the regular time (use a back up method of birth control for the next 48 hours). If more than 1 dose is missed, use an additional birth control method for the rest of your pill pack until menses occurs. Contact your health care professional if more than 1 dose has been missed. What may interact with this medicine? Do not take this medicine with any of the following medications: -amprenavir or fosamprenavir -bosentan This  medicine may also interact with the following medications: -antibiotics or medicines for infections, especially rifampin, rifabutin, rifapentine, and griseofulvin, and possibly penicillins or tetracyclines -aprepitant -barbiturate medicines, such as phenobarbital -carbamazepine -felbamate -modafinil -oxcarbazepine -phenytoin -ritonavir or other medicines for HIV infection or AIDS -St. John's wort -topiramate This list may not describe all possible interactions. Give your health care provider a list of all the medicines, herbs, non-prescription drugs, or dietary supplements you use. Also tell them if you smoke, drink alcohol, or use illegal drugs. Some items may interact with your medicine. What should I watch for while using this medicine? Visit your doctor or health care professional for regular checks on your progress. You will need a regular breast and pelvic exam and Pap smear while on this medicine. Use an additional method of birth control during the first cycle that you take these tablets. If you have any reason to think you are pregnant, stop taking this medicine right away and contact your doctor or health care professional. If you are taking this medicine for hormone related problems, it may take several cycles of use to see improvement in your condition. This medicine does not protect you against HIV infection (AIDS) or any other sexually transmitted diseases. What side effects may I notice from receiving this medicine? Side effects that you should report to your doctor or health care professional as soon as possible: -breast tenderness or discharge -pain in the abdomen, chest, groin or leg -severe headache -skin rash, itching, or hives -sudden shortness of breath -unusually weak or tired -vision or speech problems -yellowing of skin or eyes Side effects that usually do not require medical attention (report to your doctor or health care professional if they continue or are  bothersome): -changes in sexual desire -change in menstrual flow -facial hair growth -fluid retention and swelling -headache -irritability -nausea -weight gain or loss This list may not describe all possible side effects. Call your doctor for medical advice about side effects. You may report side effects to FDA at 1-800-FDA-1088. Where should I keep my medicine? Keep out of the reach of children. Store at room temperature between 15 and 30 degrees C (59 and 86 degrees F). Throw away any unused medicine after the expiration date. NOTE: This sheet is a summary. It may not cover all possible information. If you have questions about this medicine, talk to your doctor, pharmacist, or health care provider.  2014, [employer]vier/[employer] Standard. (2012-08-24 16:41:35)

## 2014-03-05 NOTE — Progress Notes (Signed)
Post discharge review completed. 

## 2014-03-12 ENCOUNTER — Ambulatory Visit: Payer: Self-pay

## 2014-03-12 ENCOUNTER — Encounter: Payer: Self-pay | Admitting: *Deleted

## 2014-03-12 NOTE — Lactation Note (Signed)
This note was copied from the chart of Kristen Phillips. Infant Lactation Consultation Outpatient Visit Note  Patient Name: Kristen Phillips Date of Birth: 02/28/2016 Birth Weight:  4 lb 8.1 oz (2045 g) Gestational Age at Delivery: Gestational Age: [id number] Type of Delivery: Vaginal Delivery  BW - 4-8 oz  D/C weight - 4-4 oz  1st Dr. Visit - 4-7 oz 3/16 Smart start - 4-8 oz 3/17  TODAY'S WEIGHT - 4-14 OZ  Reason for LC visit - F/u due to [redacted] weeks gestation and ,5 pounds at birth . Breastfeeding History- baby was latching well in the hospital and mom was pumping every 3 hours , milk was in the day of D/C. [facility]  Frequency of Breastfeeding: every 2-3 hours sometimes 4 hours  Length of Feeding: 10-15 mins , and then supplement 45 ml of EBM or formula 22 calorie  Voids: 5-6  Stools: 2-3 greenish / yellow seedy   Supplementing / Method: Pumping:  Type of Pump: DEBP Latina    Frequency: after 2-3 feedings both for 15 - 20 mins   Volume:  Was 4 oz and now it has decreased to 2 oz   Comments:last pump at 8 pm last night and breast fed at 1100 and have not breast fed or pumped since .  Mom seemed surprised milk supply had decreased . LC reviewed supply and demand     Consultation Evaluation: Both breast soft to [place] full both breast , nipple appeared healthy , no break down.  Last fed at 0700 2 oz formula spit up and an hour later fed 1 oz formula   Initial Feeding Assessment: Pre-feed Weight: 4-14 oz 2212 g  Post-feed Weight: 4-14.3 oz 2220 g  Amount Transferred: 8 ml  Comments:  Additional Feeding Assessment: Pre-feed Weight:4-14.3 oz 2220 g  Post-feed Weight: 4-14.4 oz 2224 g  Amount Transferred: 4 ml  Comments:  Additional Feeding Assessment: Pre-feed Weight:4-14.4 oz 2224 g  Post-feed Weight: 4-14.8 oz 2234 g  Amount Transferred: 10 ml  Comments:  Total Breast milk Transferred this Visit: 22 ml  Total Supplement Given: 45 ml formula  Total  - 67 ml  Comments - baby latched both breast with depth and noted swallows , increased with breast compressions Stressed to mom the importance of protecting milk supply. And the importance of supplementing after every feeding  Until the milk volume has increased.   Lactation Plan of Care - Praised mom for her breast feeding efforts and pumping                                       - Encouraged plenty fluids , especially water , nutritious snacks , rest , naps                                       - Protect established milk supply and increase volume back up                             Steps - maintaining a consistent pumping schedule                              Baby to the Breast 8  X's a day                              Post pump 10 -15 mins , do extra pumping days and evenings , only at night if [facility] doesn't feed                 Important - Don't go over 4 hours with some stimulation to the breast either breast feed or pump .                              Keep pumping diary    Follow-Up- Per mom F/U this Friday 3/27 with [facility] Dr. Henri Medal                    - 4/1 Wednesday 9 am       Kathrin Greathouse 03/12/2014, 9:12 AM

## 2014-03-19 ENCOUNTER — Ambulatory Visit: Payer: Self-pay

## 2014-03-19 NOTE — Lactation Note (Signed)
This note was copied from the chart of Kristen Phillips. Infant Lactation Consultation Outpatient Visit Note  Patient Name: Kristen Phillips Date of Birth: 02/28/2016 Birth Weight:  4 lb 8.1 oz (2045 g) Gestational Age at Delivery: Gestational Age: 3287w6d Type of Delivery: SVB  Breastfeeding History Frequency of Breastfeeding: 2 times per day Length of Feeding: 10-15 minutes, sometimes 1 breast then bottle Voids: 7-8/day Stools: 1-2 per day/green, pasty  Supplementing / Method: Pumping:  Type of Pump:  DEBP - Lactina   Frequency:  4 times/day for 20 minutes on average  Volume:  1 1/2 - 2 oz  Comments: Mom is here for follow up OP visit from 03/12/14. Baby is taking 3 oz of Similac Neosure each feeding, about every 2-3 hours. Mom is drinking Mother's Milk Tea and feels it is helping increase her milk supply. LC asked Mom her goal with feeding her baby and she reports that she wants to increase her milk supply so baby will get more EBM. She would like baby to latch if she will but is fine with pumping and bottle feeding if not latching. Weight at last OP appointment on 03/12/14 was 4 lb. 14 oz. Baby now 532+ weeks of age.    Consultation Evaluation:  Initial Feeding Assessment: Pre-feed Weight:    5 lb. 5.9 oz/2436 gm Post-feed Weight:   5 lb. 6.5 oz/2454 gm Amount Transferred:  18ml Comments:  From right breast with nursing for 15 minutes. Assisted Mom in cross cradle and football hold to obtain more depth with latch. Baby demonstrates a good rhythmic suck with some swallows noted.   Additional Feeding Assessment: Pre-feed Weight:   5 lb. 6.5 oz/2454 gm Post-feed Weight:   5 lb. 6.9 oz/2464 gm Amount Transferred:  10 ml Comments: after nursing on left breast in cross cradle for 12 minutes. Again worked with Mom with positioning, supporting breast and obtaining good depth with latch.   Mom post pumped and received 14 ml total using DEBP for 20 minutes.   Additional Feeding  Assessment: Pre-feed Weight: Post-feed Weight: Amount Transferred: Comments:  Total Breast milk Transferred this Visit: 28 ml.  Total Supplement Given: 35 ml of Neosure  Additional Interventions: Mom reports her goal is to increase her milk supply. Advised Mom the best way to do this is by putting the baby to the breast more frequently and to continue post pumping till milk supply increases. Advised to pre-pump thru 1st milk ejection, 3-5 minutes, so baby will get higher fat content milk when at the breast. BF every feeding, 15 minutes each breast. Be sure you have deep latch. Supplement with 60 ml of EBM or formula if baby is going to the breast each feeding for 15 minutes each breast. If baby is not going to the breast, supplement with 90 ml of EBM/formula. Post pump at least 4-6 time/day if baby at the breast every feeding, pumping for 15-20 minutes. If baby not going to breast, pump every 3 hours for 15-20 minutes to increase milk production and have EBM to give as supplement.  Continue Mother's milk tea, recipe for lactation cookies given.   Follow-Up Mom will call if desires another OP appointment. Encouraged to come to support group.      Alfred LevinsGranger, Kristen Phillips 03/19/2014, 9:06 AM

## 2014-10-20 ENCOUNTER — Encounter (HOSPITAL_COMMUNITY): Payer: Self-pay

## 2019-10-23 ENCOUNTER — Other Ambulatory Visit: Payer: Self-pay

## 2019-10-23 ENCOUNTER — Inpatient Hospital Stay (HOSPITAL_COMMUNITY): Payer: PRIVATE HEALTH INSURANCE

## 2019-10-23 ENCOUNTER — Inpatient Hospital Stay (HOSPITAL_COMMUNITY)
Admission: AD | Admit: 2019-10-23 | Discharge: 2019-10-23 | Disposition: A | Discharge status: Home / Self Care | Payer: PRIVATE HEALTH INSURANCE | Attending: Obstetrics and Gynecology | Admitting: Obstetrics and Gynecology

## 2019-10-23 ENCOUNTER — Encounter (HOSPITAL_COMMUNITY): Payer: Self-pay | Admitting: *Deleted

## 2019-10-23 DIAGNOSIS — N939 Abnormal uterine and vaginal bleeding, unspecified: Secondary | ICD-10-CM | POA: Diagnosis present

## 2019-10-23 DIAGNOSIS — Z87891 Personal history of nicotine dependence: Secondary | ICD-10-CM | POA: Diagnosis not present

## 2019-10-23 DIAGNOSIS — Z3A01 Less than 8 weeks gestation of pregnancy: Secondary | ICD-10-CM | POA: Insufficient documentation

## 2019-10-23 DIAGNOSIS — Z3A11 11 weeks gestation of pregnancy: Secondary | ICD-10-CM | POA: Diagnosis not present

## 2019-10-23 DIAGNOSIS — O200 Threatened abortion: Secondary | ICD-10-CM

## 2019-10-23 DIAGNOSIS — O34219 Maternal care for unspecified type scar from previous cesarean delivery: Secondary | ICD-10-CM | POA: Diagnosis not present

## 2019-10-23 DIAGNOSIS — O209 Hemorrhage in early pregnancy, unspecified: Secondary | ICD-10-CM | POA: Insufficient documentation

## 2019-10-23 DIAGNOSIS — O3680X0 Pregnancy with inconclusive fetal viability, not applicable or unspecified: Secondary | ICD-10-CM | POA: Diagnosis not present

## 2019-10-23 LAB — HCG, QUANTITATIVE, PREGNANCY: hCG, Beta Chain, Quant, S: 23970 m[IU]/mL — ABNORMAL HIGH (ref <5)

## 2019-10-23 LAB — CBC
HCT: 33.3 % — ABNORMAL LOW (ref 36.0–46.0)
Hemoglobin: 11.6 g/dL — ABNORMAL LOW (ref 12.0–15.0)
MCH: 34.2 pg — ABNORMAL HIGH (ref 26.0–34.0)
MCHC: 34.8 g/dL (ref 30.0–36.0)
MCV: 98.2 fL (ref 80.0–100.0)
Platelets: 269 10*3/uL (ref 150–400)
RBC: 3.39 MIL/uL — ABNORMAL LOW (ref 3.87–5.11)
RDW: 11.8 % (ref 11.5–15.5)
WBC: 7.9 10*3/uL (ref 4.0–10.5)
nRBC: 0 % (ref 0.0–0.2)

## 2019-10-23 LAB — POCT PREGNANCY, URINE: Preg Test, Ur: POSITIVE — AB

## 2019-10-23 LAB — WET PREP, GENITAL
Sperm: NONE SEEN
Trich, Wet Prep: NONE SEEN
Yeast Wet Prep HPF POC: NONE SEEN

## 2019-10-23 LAB — HIV ANTIBODY (ROUTINE TESTING W REFLEX): HIV Screen 4th Generation wRfx: NONREACTIVE

## 2019-10-23 NOTE — MAU Provider Note (Signed)
Chief Complaint: No chief complaint on file.  Provider saw patient at  2020    SUBJECTIVE HPI: Kristen Phillips is a 33 y.o. [id number] at [redacted]w[redacted]d by LMP who presents to maternity admissions reporting Post Coital spotting since this afternoon.  States bleeding got a bit heavier this evening.  No pain.   Has had pregnancy confirmation at [facility]. [facility] She denies vaginal itching/burning, urinary symptoms, h/a, dizziness, n/v, or fever/chills.    Vaginal Bleeding The patient's primary symptoms include missed menses and vaginal bleeding. The patient's pertinent negatives include no genital itching, genital lesions, genital odor or pelvic pain. This is a new problem. The current episode started today. The problem occurs constantly. The problem has been unchanged. The patient is experiencing no pain. She is pregnant. Pertinent negatives include no abdominal pain, back pain, constipation, diarrhea, fever, nausea or vomiting. The vaginal discharge was bloody. The vaginal bleeding is lighter than menses. She has not been passing clots. She has not been passing tissue. She is sexually active.   RN Note: Pt presents with c/o spotting since 15oo this afternoon.  Denies abdominal pain/cramping.  Reports intercourse this morning.  LMP 08/06/2019.  Reports +UPT @ home & [facility].  States doesn't have pregnancy verification letter with her.  Past Medical History:  Diagnosis Date  . YNWGNFAO(130.8)    Past Surgical History:  Procedure Laterality Date  . CESAREAN SECTION    . INDUCED ABORTION     Social History   Socioeconomic History  . Marital status: Single    Spouse name: Not on file  . Number of children: Not on file  . Years of education: Not on file  . Highest education level: Not on file  Occupational History  . Not on file  Social Needs  . Financial resource strain: Not on file  . Food insecurity    Worry: Not on file    Inability: Not on file  . Transportation needs    Medical: Not on file     Non-medical: Not on file  Tobacco Use  . Smoking status: Former Smoker    Packs/day: 0.25    Years: 12.00    Pack years: 3.00    Types: Cigarettes    Quit date: 07/13/2013    Years since quitting: 6.2  Substance and Sexual Activity  . Alcohol use: No  . Drug use: No  . Sexual activity: Not Currently    Birth control/protection: None    Comment: Several weeks since last intercourse  Lifestyle  . Physical activity    Days per week: Not on file    Minutes per session: Not on file  . Stress: Not on file  Relationships  . Social Musician on phone: Not on file    Gets together: Not on file    Attends religious service: Not on file    Active member of club or organization: Not on file    Attends meetings of clubs or organizations: Not on file    Relationship status: Not on file  . Intimate partner violence    Fear of current or ex partner: Not on file    Emotionally abused: Not on file    Physically abused: Not on file    Forced sexual activity: Not on file  Other Topics Concern  . Not on file  Social History Narrative  . Not on file   No current facility-administered medications on file prior to encounter.    Current Outpatient Medications  on File Prior to Encounter  Medication Sig Dispense Refill  . chlorhexidine (PERIDEX) 0.12 % solution SMARTSIG:15 Milliliter(s) By Mouth Morning-Night    . ibuprofen (ADVIL,MOTRIN) 600 MG tablet Take 1 tablet (600 mg total) by mouth every 6 (six) hours. 30 tablet 0  . norethindrone (ORTHO MICRONOR) 0.35 MG tablet Take 1 tablet (0.35 mg total) by mouth daily. 1 Package 11  . oxyCODONE-acetaminophen (PERCOCET/ROXICET) 5-325 MG per tablet Take 1-2 tablets by mouth every 4 (four) hours as needed for severe pain (moderate - severe pain). 30 tablet 0  . Prenatal Vit-Fe Fumarate-FA (PRENATAL MULTIVITAMIN) TABS tablet Take 1 tablet by mouth daily at 12 noon.     No Known Allergies  I have reviewed patient's Past Medical Hx, Surgical  Hx, Family Hx, Social Hx, medications and allergies.   ROS:  Review of Systems  Constitutional: Negative for fever.  Gastrointestinal: Negative for abdominal pain, constipation, diarrhea, nausea and vomiting.  Genitourinary: Positive for missed menses and vaginal bleeding. Negative for pelvic pain.  Musculoskeletal: Negative for back pain.   Review of Systems  Other systems negative   Physical Exam  Physical Exam Patient Vitals for the past 24 hrs:  BP Temp Temp src Pulse Resp SpO2 Height Weight  10/23/19 1855 110/65 98.5 F (36.9 C) Oral 83 20 100 % - -  10/23/19 1848 - - - - - - 5\' 2"  (1.575 m) 65.8 kg   Constitutional: Well-developed, well-nourished female in no acute distress.  Cardiovascular: normal rate Respiratory: normal effort GI: Abd soft, non-tender. Pos BS x 4 MS: Extremities nontender, no edema, normal ROM Neurologic: Alert and oriented x 4.  GU: Neg CVAT.  PELVIC EXAM: Cervix pink, visually closed, without lesion, small bloody discharge, vaginal walls and external genitalia normal Bimanual exam: Cervix 0/long/high, firm, anterior, neg CMT, uterus nontender, enlarged, Uterus is retroverted  adnexa without tenderness, enlargement, or mass   LAB RESULTS Results for orders placed or performed during the hospital encounter of 10/23/19 (from the past 24 hour(s))  Pregnancy, urine POC     Status: Abnormal   Collection Time: 10/23/19  7:02 PM  Result Value Ref Range   Preg Test, Ur POSITIVE (A) NEGATIVE  HIV Antibody (routine testing w rflx)     Status: None   Collection Time: 10/23/19  8:13 PM  Result Value Ref Range   HIV Screen 4th Generation wRfx NON REACTIVE NON REACTIVE  CBC     Status: Abnormal   Collection Time: 10/23/19  8:13 PM  Result Value Ref Range   WBC 7.9 4.0 - 10.5 K/uL   RBC 3.39 (L) 3.87 - 5.11 MIL/uL   Hemoglobin 11.6 (L) 12.0 - 15.0 g/dL   HCT 33.3 (L) 36.0 - 46.0 %   MCV 98.2 80.0 - 100.0 fL   MCH 34.2 (H) 26.0 - 34.0 pg   MCHC 34.8 30.0  - 36.0 g/dL   RDW 11.8 11.5 - 15.5 %   Platelets 269 150 - 400 K/uL   nRBC 0.0 0.0 - 0.2 %  hCG, quantitative, pregnancy     Status: Abnormal   Collection Time: 10/23/19  8:13 PM  Result Value Ref Range   hCG, Beta Chain, Quant, S 23,970 (H) <5 mIU/mL  Wet prep, genital     Status: Abnormal   Collection Time: 10/23/19  8:38 PM   Specimen: PATH Cytology Cervicovaginal Ancillary Only  Result Value Ref Range   Yeast Wet Prep HPF POC NONE SEEN NONE SEEN   Trich, Wet Prep  NONE SEEN NONE SEEN   Clue Cells Wet Prep HPF POC PRESENT (A) NONE SEEN   WBC, Wet Prep HPF POC MANY (A) NONE SEEN   Sperm NONE SEEN     IMAGING US Ob Comp Less 14 Wks  Result Date: 10/23/2019 CLINICAL DATA:  Vaginal bleeding. EXAM: OBSTETRIC <14 WK [place] AND TRANSVAGINAL OB US TECHNIQUE: Both transabdominal and transvaginal ultrasound examinations were performed for complete evaluation of the gestation as well as the maternal uterus, adnexal regions, and pelvic cul-de-sac. Transvaginal technique was performed to assess early pregnancy. COMPARISON:  None. FINDINGS: Intrauterine gestational sac: Single Yolk sac:  Visualized Embryo:  Suspected small fetal pole visualized Cardiac Activity: Not visualized CRL:  3.2 mm   5 w   6 d                  [place] EDC: 06/18/2020 Subchorionic hemorrhage:  3.3 x 2.3 x 1.4 cm subchorionic hemorrhage Maternal uterus/adnexae: Technically difficult examination due to retroverted uterus. Unremarkable ovaries. No free fluid. IMPRESSION: Intrauterine gestation with a yolk sac and suspected small embryo visualized but no cardiac activity detected. Findings are suspicious but not yet definitive for failed pregnancy. Recommend follow-up US in 10-14 days for definitive diagnosis. This recommendation follows SRU consensus guidelines: Diagnostic Criteria for Nonviable Pregnancy Early in the First Trimester. Malva Limes Med 2013; 147:8295-62. Electronically Signed   By: Sebastian Ache M.D.   On: 10/23/2019 21:32   US Ob  Transvaginal  Result Date: 10/23/2019 CLINICAL DATA:  Vaginal bleeding. EXAM: OBSTETRIC <14 WK [place] AND TRANSVAGINAL OB US TECHNIQUE: Both transabdominal and transvaginal ultrasound examinations were performed for complete evaluation of the gestation as well as the maternal uterus, adnexal regions, and pelvic cul-de-sac. Transvaginal technique was performed to assess early pregnancy. COMPARISON:  None. FINDINGS: Intrauterine gestational sac: Single Yolk sac:  Visualized Embryo:  Suspected small fetal pole visualized Cardiac Activity: Not visualized CRL:  3.2 mm   5 w   6 d                  [place] EDC: 06/18/2020 Subchorionic hemorrhage:  3.3 x 2.3 x 1.4 cm subchorionic hemorrhage Maternal uterus/adnexae: Technically difficult examination due to retroverted uterus. Unremarkable ovaries. No free fluid. IMPRESSION: Intrauterine gestation with a yolk sac and suspected small embryo visualized but no cardiac activity detected. Findings are suspicious but not yet definitive for failed pregnancy. Recommend follow-up US in 10-14 days for definitive diagnosis. This recommendation follows SRU consensus guidelines: Diagnostic Criteria for Nonviable Pregnancy Early in the First Trimester. Malva Limes Med 2013; 130:8657-84. Electronically Signed   By: Sebastian Ache M.D.   On: 10/23/2019 21:32    MAU Management/MDM: Ordered usual first trimester r/o ectopic labs.   Pelvic exam and cultures done Will check baseline Ultrasound to rule out ectopic.  This bleeding/pain can represent a normal pregnancy with bleeding, spontaneous abortion or even an ectopic which can be life-threatening.  The process as listed above helps to determine which of these is present.  Reviewed results. US showed single IUGS with yolk sac and small fetal pole Discussed we cannot verify viability just yet Recommend followup US 7-10 days.   ASSESSMENT Bleeding in pregnancy Pregnancy of unknown location Single IUP at [redacted]w[redacted]d   PLAN Discharge home Will  recommend repeat  Ultrasound in about 7-10 days  Patient plans to do followup US at [facility]  Pt stable at time of discharge. Encouraged to return here or to other Urgent Care/ED if she  develops worsening of symptoms, increase in pain, fever, or other concerning symptoms.    Wynelle BourgeoisMarie Bango CNM, MSN Certified Nurse-Midwife 10/23/2019  8:18 PM

## 2019-10-23 NOTE — Discharge Instructions (Signed)
Vaginal Bleeding During Pregnancy, First Trimester  A small amount of bleeding from the vagina (spotting) is relatively common during early pregnancy. It usually stops on its own. Various things may cause bleeding or spotting during early pregnancy. Some bleeding may be related to the pregnancy, and some may not. In many cases, the bleeding is normal and is not a problem. However, bleeding can also be a sign of something serious. Be sure to tell your health care provider about any vaginal bleeding right away. Some possible causes of vaginal bleeding during the first trimester include:  Infection or inflammation of the cervix.  Growths (polyps) on the cervix.  Miscarriage or threatened miscarriage.  Pregnancy tissue developing outside of the uterus (ectopic pregnancy).  A mass of tissue developing in the uterus due to an egg being fertilized incorrectly (molar pregnancy). Follow these instructions at home: Activity  Follow instructions from your health care provider about limiting your activity. Ask what activities are safe for you.  If needed, make plans for someone to help with your regular activities.  Do not have sex or orgasms until your health care provider says that this is safe. General instructions  Take over-the-counter and prescription medicines only as told by your health care provider.  Pay attention to any changes in your symptoms.  Do not use tampons or douche.  Write down how many pads you use each day, how often you change pads, and how soaked (saturated) they are.  If you pass any tissue from your vagina, save the tissue so you can show it to your health care provider.  Keep all follow-up visits as told by your health care provider. This is important. Contact a health care provider if:  You have vaginal bleeding during any part of your pregnancy.  You have cramps or labor pains.  You have a fever. Get help right away if:  You have severe cramps in your  back or abdomen.  You pass large clots or a large amount of tissue from your vagina.  Your bleeding increases.  You feel light-headed or weak, or you faint.  You have chills.  You are leaking fluid or have a gush of fluid from your vagina. Summary  A small amount of bleeding (spotting) from the vagina is relatively common during early pregnancy.  Various things may cause bleeding or spotting in early pregnancy.  Be sure to tell your health care provider about any vaginal bleeding right away. This information is not intended to replace advice given to you by your health care provider. Make sure you discuss any questions you have with your health care provider. Document Released: 09/14/2005 Document Revised: 03/26/2019 Document Reviewed: 03/09/2017 Elsevier Patient Education  2020 [employer].   Threatened Miscarriage  A threatened miscarriage is when you have bleeding from your vagina during the first 20 weeks of pregnancy but the pregnancy does not end. Your doctor will do tests to make sure you are still pregnant. The cause of the bleeding may not be known. This condition does not mean your pregnancy will end, but it does increase the risk that it will end (complete miscarriage). Follow these instructions at home:  Get plenty of rest.  If you have bleeding in your vagina, do not have sex or use tampons.  Do not douche.  Do not smoke or use drugs.  Do not drink alcohol.  Avoid caffeine.  Keep all follow-up prenatal visits as told by your doctor. This is important. Contact a doctor if:  You have light bleeding from your vagina.  You have belly pain or cramping.  You have a fever. Get help right away if:  You have heavy bleeding from your vagina.  You have clots of blood coming from your vagina.  You pass tissue from your vagina.  You have a gush of fluid from your vagina.  You are leaking fluid from your vagina.  You have very bad pain or cramps in your  low back or belly.  You have fever, chills, and very bad belly pain. Summary  A threatened miscarriage is when you have bleeding from your vagina during the first 20 weeks of pregnancy but the pregnancy does not end.  This condition does not mean your pregnancy will end, but it does increase the risk that it will end (complete miscarriage).  Get plenty of rest. If you have bleeding in your vagina, do not have sex or use tampons.  Keep all follow-up prenatal visits as told by your doctor. This is important. This information is not intended to replace advice given to you by your health care provider. Make sure you discuss any questions you have with your health care provider. Document Released: 11/17/2008 Document Revised: 01/11/2018 Document Reviewed: 03/03/2017 Elsevier Patient Education  [place].

## 2019-10-23 NOTE — Progress Notes (Signed)
M Albo CNM in earlier to discuss test results and d/c plan. Written and verbal d/c instructions given and understanding voiced 

## 2019-10-23 NOTE — MAU Note (Addendum)
Pt presents with c/o spotting since 1500 this afternoon.  Denies abdominal pain/cramping.  Reports intercourse this morning.  LMP 08/06/2019.  Reports +UPT @ home & [facility].  States doesn't have pregnancy verification letter with her.

## 2019-10-24 LAB — GC/CHLAMYDIA PROBE AMP (~~LOC~~) NOT AT ARMC
Chlamydia: NEGATIVE
Comment: NEGATIVE
Comment: NORMAL
Neisseria Gonorrhea: NEGATIVE

## 2019-10-26 ENCOUNTER — Inpatient Hospital Stay (HOSPITAL_COMMUNITY)
Admission: AD | Admit: 2019-10-26 | Discharge: 2019-10-26 | Disposition: A | Discharge status: Home / Self Care | Payer: PRIVATE HEALTH INSURANCE | Attending: Obstetrics and Gynecology | Admitting: Obstetrics and Gynecology

## 2019-10-26 ENCOUNTER — Other Ambulatory Visit: Payer: Self-pay

## 2019-10-26 ENCOUNTER — Encounter (HOSPITAL_COMMUNITY): Payer: Self-pay

## 2019-10-26 DIAGNOSIS — Z3A01 Less than 8 weeks gestation of pregnancy: Secondary | ICD-10-CM | POA: Diagnosis not present

## 2019-10-26 DIAGNOSIS — F17210 Nicotine dependence, cigarettes, uncomplicated: Secondary | ICD-10-CM | POA: Diagnosis not present

## 2019-10-26 DIAGNOSIS — O039 Complete or unspecified spontaneous abortion without complication: Secondary | ICD-10-CM | POA: Insufficient documentation

## 2019-10-26 DIAGNOSIS — O209 Hemorrhage in early pregnancy, unspecified: Secondary | ICD-10-CM | POA: Diagnosis present

## 2019-10-26 DIAGNOSIS — O99331 Smoking (tobacco) complicating pregnancy, first trimester: Secondary | ICD-10-CM | POA: Diagnosis not present

## 2019-10-26 LAB — URINALYSIS, ROUTINE W REFLEX MICROSCOPIC
Bilirubin Urine: NEGATIVE
Glucose, UA: NEGATIVE mg/dL
Ketones, ur: NEGATIVE mg/dL
Leukocytes,Ua: NEGATIVE
Nitrite: NEGATIVE
Protein, ur: NEGATIVE mg/dL
Specific Gravity, Urine: 1.008 (ref 1.005–1.030)
pH: 6 (ref 5.0–8.0)

## 2019-10-26 LAB — CBC
HCT: 36.5 % (ref 36.0–46.0)
Hemoglobin: 12.2 g/dL (ref 12.0–15.0)
MCH: 33.5 pg (ref 26.0–34.0)
MCHC: 33.4 g/dL (ref 30.0–36.0)
MCV: 100.3 fL — ABNORMAL HIGH (ref 80.0–100.0)
Platelets: 313 10*3/uL (ref 150–400)
RBC: 3.64 MIL/uL — ABNORMAL LOW (ref 3.87–5.11)
RDW: 11.7 % (ref 11.5–15.5)
WBC: 9.1 10*3/uL (ref 4.0–10.5)
nRBC: 0 % (ref 0.0–0.2)

## 2019-10-26 LAB — HCG, QUANTITATIVE, PREGNANCY: hCG, Beta Chain, Quant, S: 8950 m[IU]/mL — ABNORMAL HIGH (ref <5)

## 2019-10-26 MED ORDER — OXYCODONE-ACETAMINOPHEN 5-325 MG PO TABS: 1.0000 | ORAL_TABLET | Freq: Four times a day (QID) | ORAL | 0 refills | Status: DC | PRN

## 2019-10-26 MED ORDER — OXYCODONE-ACETAMINOPHEN 5-325 MG PO TABS
2.0000 | ORAL_TABLET | Freq: Once | ORAL | Status: AC
  Administered 2019-10-26: 2 via ORAL
  Filled 2019-10-26: qty 2

## 2019-10-26 NOTE — MAU Provider Note (Signed)
History     CSN: [id number]  Arrival date and time: 10/26/19 0845   First Provider Initiated Contact with Patient 10/26/19 [phone]      Chief Complaint  Patient presents with  . Abdominal Pain  . Vaginal Bleeding   Kristen Phillips KnifeWilliams is a 33 y.o. G6P2 at 1126w2d who presents to MAU with complaints of vaginal bleeding and abdominal pain. She was seen on 11/4 for vaginal bleeding and US showed GS and Yolk sac but no embryo. She reports vaginal bleeding and abdominal pain increased yesterday. Describes passing three large clots and having to wear a pad yesterday but bleeding has decreased to only needing a panty liner today. She reports vaginal bleeding is associated with abdominal pain. Describes as "contractions", reports lower abdominal cramping occurs in waves like contractions, rates pain 7/10- has not taken any medication for abdominal pain. She plans on starting prenatal care with [facility] - delivered previous child with them.   OB History    Gravida  6   Para  2   Term  1   Preterm  1   AB  3   Living  2     SAB      TAB  3   Ectopic      Multiple      Live Births  1           Past Medical History:  Diagnosis Date  . JXBJYNWG(956.2Headache(784.0)     Past Surgical History:  Procedure Laterality Date  . CESAREAN SECTION    . INDUCED ABORTION      Family History  Problem Relation Age of Onset  . Diabetes Mother   . Diabetes Maternal Grandmother   . Diabetes Paternal Grandmother     Social History   Tobacco Use  . Smoking status: Former Smoker    Packs/day: 0.25    Years: 12.00    Pack years: 3.00    Types: Cigarettes    Quit date: 07/13/2013    Years since quitting: 6.2  . Smokeless tobacco: Never Used  Substance Use Topics  . Alcohol use: No  . Drug use: No    Allergies: No Known Allergies  Medications Prior to Admission  Medication Sig Dispense Refill Last Dose  . chlorhexidine (PERIDEX) 0.12 % solution SMARTSIG:15 Milliliter(s) By Mouth Morning-Night      . doxylamine, Sleep, (UNISOM) 25 MG tablet Take 25 mg by mouth at bedtime as needed.     . Prenatal Vit-Fe Fumarate-FA (PRENATAL MULTIVITAMIN) TABS tablet Take 1 tablet by mouth daily at 12 noon.     . Pyridoxine HCl (B-6 PO) Take by mouth.       Review of Systems  Constitutional: Negative.   Respiratory: Negative.   Cardiovascular: Negative.   Gastrointestinal: Positive for abdominal pain. Negative for constipation, diarrhea, nausea and vomiting.  Genitourinary: Positive for vaginal bleeding. Negative for difficulty urinating, dysuria, hematuria, pelvic pain and urgency.  Musculoskeletal: Negative.   Neurological: Negative.    Physical Exam   Blood pressure 109/65, pulse 92, temperature 98.6 F (37 C), temperature source Oral, resp. rate 16, height 5\' 2"  (1.575 m), weight 64.7 kg, last menstrual period 08/06/2019, SpO2 100 %, unknown if currently breastfeeding.  Physical Exam  Nursing note and vitals reviewed. Constitutional: She is oriented to person, place, and time. She appears well-developed and well-nourished.  Patient is tearful and nervous   Cardiovascular: Normal rate and regular rhythm.  Respiratory: Effort normal and breath sounds normal. No respiratory distress.  She has no wheezes.  GI: Soft. She exhibits no distension. There is no abdominal tenderness. There is no rebound and no guarding.  Genitourinary:    Vaginal bleeding present.  There is bleeding in the vagina.    Genitourinary Comments: Pelvic exam: Cervix pink, visually slightly open, without lesion, small amount of dark red vaginal bleeding- no clots, vaginal walls and external genitalia normal Bimanual exam: Cervix 0.5/long/high, firm, anterior, neg CMT, uterus nontender, nonenlarged, adnexa without tenderness, enlargement, or mass   Musculoskeletal: Normal range of motion.        General: No edema.  Neurological: She is alert and oriented to person, place, and time.  Psychiatric: She has a normal mood and  affect. Her behavior is normal. Thought content normal.   MAU Course  Procedures  MDM Orders Placed This Encounter  Procedures  . Urinalysis, Routine w reflex microscopic  . hCG, quantitative, pregnancy  . CBC   Treatments in MAU include 2 tablets of percocet while labs pending  Reassessment of pain, patient reports pain is resolved   Educated and discussed lab results with patient - HCG was 23,970 on 11/4 and today is 8,950- confirms miscarriage in early pregnancy.   Results for orders placed or performed during the hospital encounter of 10/26/19 (from the past 24 hour(s))  hCG, quantitative, pregnancy     Status: Abnormal   Collection Time: 10/26/19  9:33 AM  Result Value Ref Range   hCG, Beta Chain, Quant, S 8,950 (H) <5 mIU/mL  CBC     Status: Abnormal   Collection Time: 10/26/19  9:33 AM  Result Value Ref Range   WBC 9.1 4.0 - 10.5 K/uL   RBC 3.64 (L) 3.87 - 5.11 MIL/uL   Hemoglobin 12.2 12.0 - 15.0 g/dL   HCT 16.1 09.6 - 04.5 %   MCV 100.3 (H) 80.0 - 100.0 fL   MCH 33.5 26.0 - 34.0 pg   MCHC 33.4 30.0 - 36.0 g/dL   RDW 40.9 81.1 - 91.4 %   Platelets 313 150 - 400 K/uL   nRBC 0.0 0.0 - 0.2 %  Urinalysis, Routine w reflex microscopic     Status: Abnormal   Collection Time: 10/26/19 10:19 AM  Result Value Ref Range   Color, Urine YELLOW YELLOW   APPearance HAZY (A) CLEAR   Specific Gravity, Urine 1.008 1.005 - 1.030   pH 6.0 5.0 - 8.0   Glucose, UA NEGATIVE NEGATIVE mg/dL   Hgb urine dipstick LARGE (A) NEGATIVE   Bilirubin Urine NEGATIVE NEGATIVE   Ketones, ur NEGATIVE NEGATIVE mg/dL   Protein, ur NEGATIVE NEGATIVE mg/dL   Nitrite NEGATIVE NEGATIVE   Leukocytes,Ua NEGATIVE NEGATIVE   RBC / HPF 6-10 0 - 5 RBC/hpf   WBC, UA 0-5 0 - 5 WBC/hpf   Bacteria, UA RARE (A) NONE SEEN   Squamous Epithelial / LPF 0-5 0 - 5   Mucus PRESENT     Discussed follow up with patient. Patient plans to call office on Monday and notify of miscarriage the schedule necessary follow  up with office. Rx for short course of percocet sent to pharmacy of choice.   Discussed reasons to return to MAU. Return to MAU as needed. Pt stable at time of discharge.   Meds ordered this encounter  Medications  . oxyCODONE-acetaminophen (PERCOCET/ROXICET) 5-325 MG per tablet 2 tablet  . oxyCODONE-acetaminophen (PERCOCET/ROXICET) 5-325 MG tablet    Sig: Take 1 tablet by mouth every 6 (six) hours as needed for severe pain.  Dispense:  8 tablet    Refill:  0    Order Specific Question:   Supervising Provider    Answer:   Sloan Leiter [[id number]]   Assessment and Plan   1. SAB (spontaneous abortion)    Discharge home Make appointment on Monday with [facility] for miscarriage follow up  Return to MAU as needed for reasons discussed and/or emergencies  Rx for percocet   Follow-up [place]. Schedule an appointment as soon as possible for a visit.   Why: Make appointment to be seen for miscarriage follow up  Contact information: [address] [place] [address] [phone]         Allergies as of 10/26/2019   No Known Allergies     Medication List    TAKE these medications   B-6 PO Take by mouth.   chlorhexidine 0.12 % solution Commonly known as: PERIDEX SMARTSIG:15 Milliliter(s) By Mouth Morning-Night   doxylamine (Sleep) 25 MG tablet Commonly known as: UNISOM Take 25 mg by mouth at bedtime as needed.   oxyCODONE-acetaminophen 5-325 MG tablet Commonly known as: PERCOCET/ROXICET Take 1 tablet by mouth every 6 (six) hours as needed for severe pain.   prenatal multivitamin Tabs tablet Take 1 tablet by mouth daily at 12 noon.       Lajean Manes CNM 10/26/2019, 10:57 AM

## 2019-10-26 NOTE — Discharge Instructions (Signed)
Miscarriage °A miscarriage is the loss of an unborn baby (fetus) before the 20th week of pregnancy. °Follow these instructions at home: °Medicines ° °· Take over-the-counter and prescription medicines only as told by your doctor. °· If you were prescribed antibiotic medicine, take it as told by your doctor. Do not stop taking the antibiotic even if you start to feel better. °· Do not take NSAIDs unless your doctor says that this is safe for you. NSAIDs include aspirin and ibuprofen. These medicines can cause bleeding. °Activity °· Rest as directed. Ask your doctor what activities are safe for you. °· Have someone help you at home during this time. °General instructions °· Write down how many pads you use each day and how soaked they are. °· Watch the amount of tissue or clumps of blood (blood clots) that you pass from your vagina. Save any large amounts of tissue for your doctor. °· Do not use tampons, douche, or have sex until your doctor approves. °· To help you and your partner with the process of grieving, talk with your doctor or seek counseling. °· When you are ready, meet with your doctor to talk about steps you should take for your health. Also, talk with your doctor about steps to take to have a healthy pregnancy in the future. °· Keep all follow-up visits as told by your doctor. This is important. °Contact a doctor if: °· You have a fever or chills. °· You have vaginal discharge that smells bad. °· You have more bleeding. °Get help right away if: °· You have very bad cramps or pain in your back or belly. °· You pass clumps of blood that are walnut-sized or larger from your vagina. °· You pass tissue that is walnut-sized or larger from your vagina. °· You soak more than 1 regular pad in an hour. °· You get light-headed or weak. °· You faint (pass out). °· You have feelings of sadness that do not go away, or you have thoughts of hurting yourself. °Summary °· A miscarriage is the loss of an unborn baby before  the 20th week of pregnancy. °· Follow your doctor's instructions for home care. Keep all follow-up appointments. °· To help you and your partner with the process of grieving, talk with your doctor or seek counseling. °This information is not intended to replace advice given to you by your health care provider. Make sure you discuss any questions you have with your health care provider. °Document Released: 02/27/2012 Document Revised: 03/29/2019 Document Reviewed: 01/10/2017 °Elsevier Patient Education © 2020 Elsevier Inc. ° °

## 2019-10-26 NOTE — MAU Note (Signed)
Kristen Phillips is a 33 y.o. at [redacted]w[redacted]d here in MAU reporting: yesterday evening pt reports a big blob of stuff came out. States she had 3 blobs. Having some bleeding. Wearing a pad, has not had to change her pad yet today. Also having "contractions" since yesterday.   Onset of complaint: yesterday  Pain score: 7/10  Vitals:   10/26/19 0857  BP: 109/65  Pulse: 92  Resp: 16  Temp: 98.6 F (37 C)  SpO2: 100%     Lab orders placed from triage: UA

## 2019-12-20 NOTE — L&D Delivery Note (Signed)
[facility] Delivery Note  Kristen Phillips is a 34 y.o. [id number] s/p VD at [redacted]w[redacted]d. She was being evaluated for active labor, when she SROM'd and then delivered precipitously in the MAU.   ROM: 0h 32m with clear fluid GBS Status: Unknown and Untreated  Labor Progress: . Patient presented to MAU at 2cm, but contracting regularly.  She experienced SROM and rapidly progressed to complete. She delivered as below with staff and family support.   Delivery Date/Time: 10/20/2020 at 1956 Delivery: Called to room and patient was complete and involuntarily pushing. Head delivered in Right Occiput Transverse.  After delivery of head, nuchal cord noted that shoulders and body was delivered through via somersault maneuver.  Infant with spontaneous cry and good grimace so tactile stimulation was given by provider.  Infant placed on mother's abdomen where nurse's continued tactile stimulation.  Cord clamped x 2 after 2-minute delay, and cut by FOB. Cord blood drawn. Vaginal inspection revealed a right labial laceration that was hemostatic and unrepaired.  Fundus firm, at the umbilicus, and bleeding small.  Mother hemodynamically stable and infant skin to skin prior to provider exit.  Mother desires no birth control and opts to breastfeed.  Infant weight at one hour of life: 5lbs 1 oz, 17 in   Placenta: Spontaneously  Complications: None Lacerations: Right Labial Hemostatic EBL: 100 Analgesia: None  Postpartum Planning [ ]  message to sent to schedule follow-up  [ ]  vaccines UTD  Infant: Female-Romeo  APGARs 8/9  2295g  , CNM  10/20/2020 8:36 PM

## 2020-03-29 ENCOUNTER — Inpatient Hospital Stay (HOSPITAL_COMMUNITY)
Admission: AD | Admit: 2020-03-29 | Discharge: 2020-03-29 | Disposition: A | Discharge status: Home / Self Care | Payer: No Typology Code available for payment source | Attending: Obstetrics & Gynecology | Admitting: Obstetrics & Gynecology

## 2020-03-29 ENCOUNTER — Other Ambulatory Visit: Payer: Self-pay

## 2020-03-29 ENCOUNTER — Encounter (HOSPITAL_COMMUNITY): Payer: Self-pay | Admitting: Obstetrics & Gynecology

## 2020-03-29 DIAGNOSIS — O210 Mild hyperemesis gravidarum: Secondary | ICD-10-CM | POA: Insufficient documentation

## 2020-03-29 DIAGNOSIS — Z3A01 Less than 8 weeks gestation of pregnancy: Secondary | ICD-10-CM | POA: Diagnosis not present

## 2020-03-29 DIAGNOSIS — Z87891 Personal history of nicotine dependence: Secondary | ICD-10-CM | POA: Insufficient documentation

## 2020-03-29 DIAGNOSIS — O219 Vomiting of pregnancy, unspecified: Secondary | ICD-10-CM | POA: Diagnosis not present

## 2020-03-29 LAB — URINALYSIS, ROUTINE W REFLEX MICROSCOPIC
Bacteria, UA: NONE SEEN
Bilirubin Urine: NEGATIVE
Glucose, UA: NEGATIVE mg/dL
Hgb urine dipstick: NEGATIVE
Ketones, ur: 20 mg/dL — AB
Leukocytes,Ua: NEGATIVE
Nitrite: NEGATIVE
Protein, ur: 100 mg/dL — AB
Specific Gravity, Urine: 1.028 (ref 1.005–1.030)
pH: 6 (ref 5.0–8.0)

## 2020-03-29 LAB — POCT PREGNANCY, URINE: Preg Test, Ur: POSITIVE — AB

## 2020-03-29 MED ORDER — FAMOTIDINE IN NACL 20-0.9 MG/50ML-% IV SOLN
20.0000 mg | Freq: Once | INTRAVENOUS | Status: AC
  Administered 2020-03-29: 20 mg via INTRAVENOUS
  Filled 2020-03-29: qty 50

## 2020-03-29 MED ORDER — THIAMINE HCL 100 MG/ML IJ SOLN
Freq: Once | INTRAVENOUS | Status: AC
  Filled 2020-03-29: qty 1000

## 2020-03-29 MED ORDER — PROMETHAZINE HCL 25 MG/ML IJ SOLN
25.0000 mg | Freq: Once | INTRAMUSCULAR | Status: AC
  Administered 2020-03-29: 25 mg via INTRAVENOUS
  Filled 2020-03-29: qty 1

## 2020-03-29 MED ORDER — LACTATED RINGERS IV BOLUS
1000.0000 mL | Freq: Once | INTRAVENOUS | Status: AC
  Administered 2020-03-29: 1000 mL via INTRAVENOUS

## 2020-03-29 MED ORDER — PROMETHAZINE HCL 25 MG PO TABS: 25.0000 mg | ORAL_TABLET | Freq: Four times a day (QID) | ORAL | 1 refills | Status: DC | PRN

## 2020-03-29 NOTE — MAU Note (Signed)
Patient left without signing AVS, but did receive discharge instructions and was understanding.

## 2020-03-29 NOTE — MAU Provider Note (Signed)
Chief Complaint: Nausea, Emesis, and Headache   First Provider Initiated Contact with Patient 03/29/20 1238     SUBJECTIVE HPI: Kristen Phillips is a 34 y.o. [id number] at [redacted]w[redacted]d who presents to [facility] reporting n/v & headache. States she has vomited 10+ times in the last 24 hours. Was given zofran while at work today which helped with symptoms but states nausea is starting to return. Also reports a headache. Denies abdominal pain, vaginal bleeding, diarrhea, or fever. Plans on going to [facility] for prenatal care (unsure which location).  Location: head Quality: aching Severity: 6/10 on pain scale Duration: 2 days Timing: constant Modifying factors: none Associated signs and symptoms: n/v  Past Medical History:  Diagnosis Date  . Headache(784.0)    OB History  Gravida Para Term Preterm AB Living  6 2 1 1 3 2   SAB TAB Ectopic Multiple Live Births    3     1    # Outcome Date GA Lbr Len/2nd Weight Sex Delivery Anes PTL Lv  6 Current           5 Preterm 02/28/14 [redacted]w[redacted]d 06:28 / 00:17 2045 g F VBAC EPI  LIV  4 Term 04/15/04     CS-LTranv        Birth Comments: patient reports c/s due to low fluid and baby pressing on the cord  3 TAB           2 TAB           1 TAB            Past Surgical History:  Procedure Laterality Date  . CESAREAN SECTION    . INDUCED ABORTION     Social History   Socioeconomic History  . Marital status: Single    Spouse name: Not on file  . Number of children: Not on file  . Years of education: Not on file  . Highest education level: Not on file  Occupational History  . Not on file  Tobacco Use  . Smoking status: Former Smoker    Packs/day: 0.25    Years: 12.00    Pack years: 3.00    Types: Cigarettes    Quit date: 07/13/2013    Years since quitting: 6.7  . Smokeless tobacco: Never Used  Substance and Sexual Activity  . Alcohol use: No  . Drug use: No  . Sexual activity: Not Currently    Birth control/protection: None    Comment:  Several weeks since last intercourse  Other Topics Concern  . Not on file  Social History Narrative  . Not on file   Social Determinants of Health   Financial Resource Strain: Low Risk   . Difficulty of Paying Living Expenses: Not hard at all  Food Insecurity: No Food Insecurity  . Worried About Charity fundraiser in the Last Year: Never true  . Ran Out of Food in the Last Year: Never true  Transportation Needs: No Transportation Needs  . Lack of Transportation (Medical): No  . Lack of Transportation (Non-Medical): No  Physical Activity:   . Days of Exercise per Week:   . Minutes of Exercise per Session:   Stress: No Stress Concern Present  . Feeling of Stress : Only a little  Social Connections:   . Frequency of Communication with Friends and Family:   . Frequency of Social Gatherings with Friends and Family:   . Attends Religious Services:   . Active Member of Clubs or Organizations:   .  Attends Banker Meetings:   Marland Kitchen Marital Status:   Intimate Partner Violence: Not At Risk  . Fear of Current or Ex-Partner: No  . Emotionally Abused: No  . Physically Abused: No  . Sexually Abused: No   Family History  Problem Relation Age of Onset  . Diabetes Mother   . Diabetes Maternal Grandmother   . Diabetes Paternal Grandmother    No current facility-administered medications on file prior to encounter.   Current Outpatient Medications on File Prior to Encounter  Medication Sig Dispense Refill  . Prenatal Vit-Fe Fumarate-FA (PRENATAL MULTIVITAMIN) TABS tablet Take 1 tablet by mouth daily at 12 noon.     No Known Allergies  I have reviewed patient's Past Medical Hx, Surgical Hx, Family Hx, Social Hx, medications and allergies.   Review of Systems  Constitutional: Negative.   Gastrointestinal: Positive for nausea and vomiting. Negative for abdominal pain.  Neurological: Positive for headaches.    OBJECTIVE Patient Vitals for the past 24 hrs:  BP Temp Temp src  Pulse Resp SpO2 Height Weight  03/29/20 1601 116/73 -- -- 79 15 100 % -- --  03/29/20 1150 111/71 98.8 F (37.1 C) Oral 77 16 100 % -- --  03/29/20 1132 -- -- -- -- -- -- 5\' 2"  (1.575 m) 66.3 kg   Constitutional: Well-developed, well-nourished female in no acute distress.  Cardiovascular: normal rate & rhythm, no murmur Respiratory: normal rate and effort. Lung sounds clear throughout GI: Abd soft, non-tender, Pos BS x 4. No guarding or rebound tenderness MS: Extremities nontender, no edema, normal ROM Neurologic: Alert and oriented x 4.     LAB RESULTS Results for orders placed or performed during the hospital encounter of 03/29/20 (from the past 24 hour(s))  Urinalysis, Routine w reflex microscopic     Status: Abnormal   Collection Time: 03/29/20 11:41 AM  Result Value Ref Range   Color, Urine YELLOW YELLOW   APPearance HAZY (A) CLEAR   Specific Gravity, Urine 1.028 1.005 - 1.030   pH 6.0 5.0 - 8.0   Glucose, UA NEGATIVE NEGATIVE mg/dL   Hgb urine dipstick NEGATIVE NEGATIVE   Bilirubin Urine NEGATIVE NEGATIVE   Ketones, ur 20 (A) NEGATIVE mg/dL   Protein, ur 05/29/20 (A) NEGATIVE mg/dL   Nitrite NEGATIVE NEGATIVE   Leukocytes,Ua NEGATIVE NEGATIVE   RBC / HPF 6-10 0 - 5 RBC/hpf   WBC, UA 0-5 0 - 5 WBC/hpf   Bacteria, UA NONE SEEN NONE SEEN   Squamous Epithelial / LPF 0-5 0 - 5   Mucus PRESENT   Pregnancy, urine POC     Status: Abnormal   Collection Time: 03/29/20 11:43 AM  Result Value Ref Range   Preg Test, Ur POSITIVE (A) NEGATIVE    IMAGING No results found.  MAU COURSE Orders Placed This Encounter  Procedures  . Urinalysis, Routine w reflex microscopic  . Pregnancy, urine POC  . Discharge patient   Meds ordered this encounter  Medications  . FOLLOWED BY Linked Order Group   . lactated ringers bolus 1,000 mL   . sodium chloride 0.9 % 1,000 mL with thiamine 100 mg, folic acid 1 mg, multivitamins adult 10 mL infusion  . famotidine (PEPCID) IVPB 20 mg premix  .  promethazine (PHENERGAN) injection 25 mg  . promethazine (PHENERGAN) 25 MG tablet    Sig: Take 1 tablet (25 mg total) by mouth every 6 (six) hours as needed for nausea or vomiting.    Dispense:  30 tablet  Refill:  1    Order Specific Question:   Supervising Provider    Answer:   Jaynie Collins A [[id number]]    MDM No abdominal pain or vaginal bleeding.   IV fluids, phenergan, & pepcid given for n/v. Patient reports improvement in nausea & headache. No vomiting in MAU. Given banana bag as well.  ASSESSMENT 1. Nausea and vomiting during pregnancy prior to [redacted] weeks gestation   2. [redacted] weeks gestation of pregnancy     PLAN Discharge home in stable condition. Rx phenergan Start prenatal care   Allergies as of 03/29/2020   No Known Allergies     Medication List    STOP taking these medications   B-6 PO   chlorhexidine 0.12 % solution Commonly known as: PERIDEX   doxylamine (Sleep) 25 MG tablet Commonly known as: UNISOM   ondansetron 4 MG disintegrating tablet Commonly known as: ZOFRAN-ODT   oxyCODONE-acetaminophen 5-325 MG tablet Commonly known as: PERCOCET/ROXICET     TAKE these medications   prenatal multivitamin Tabs tablet Take 1 tablet by mouth daily at 12 noon.   promethazine 25 MG tablet Commonly known as: PHENERGAN Take 1 tablet (25 mg total) by mouth every 6 (six) hours as needed for nausea or vomiting.        Judeth Horn, NP 03/29/2020  6:20 PM

## 2020-03-29 NOTE — Discharge Instructions (Signed)
Prenatal Care Providers           [facility] for [facility] @ [facility]   Phone: [phone]  [facility] for [facility] @ [facility]   Phone: [phone]  [facility] @[place]       Phone: [phone]            [facility] for [facility] @ [place]     Phone: [phone]          [facility] for [facility] @ [facility]   Phone: [phone]  Center for [facility] @ [facility]  Phone: [phone]  Center for [facility] @ [facility] Phone: [phone]     [facility]  Phone: [phone]  [facility]  Phone: [phone]  [phone] OB/GYN Phone: [phone]  [facility] Phone: [phone]  [facility] Phone: [phone]  [facility] Phone: [phone]  [facility]  Phone: [phone]   Morning Sickness  Morning sickness is when a woman feels nauseous during pregnancy. This nauseous feeling may or may not come with vomiting. It often occurs in the morning, but it can be a problem at any time of day. Morning sickness is most common during the first trimester. In some cases, it may continue throughout pregnancy. Although morning sickness is unpleasant, it is usually harmless unless the woman develops severe and continual vomiting (hyperemesis gravidarum), a condition that requires more intense treatment. What are the causes? The exact cause of this condition is not known, but it seems to be related to normal hormonal changes that occur in pregnancy. What increases the risk? You are more likely to develop this condition if:  You experienced nausea or vomiting before your pregnancy.  You had morning sickness during a previous pregnancy.  You are pregnant with more than one baby, such as twins. What are the signs or symptoms? Symptoms of this condition include:  Nausea.  Vomiting. How is this diagnosed? This  condition is usually diagnosed based on your signs and symptoms. How is this treated? In many cases, treatment is not needed for this condition. Making some changes to what you eat may help to control symptoms. Your health care provider may also prescribe or recommend:  Vitamin B6 supplements.  Anti-nausea medicines.  Ginger. Follow these instructions at home: Medicines  Take over-the-counter and prescription medicines only as told by your health care provider. Do not use any prescription, over-the-counter, or herbal medicines for morning sickness without first talking with your health care provider.  Taking multivitamins before getting pregnant can prevent or decrease the severity of morning sickness in most women. Eating and drinking  Eat a piece of dry toast or crackers before getting out of bed in the morning.  Eat 5 or 6 small meals a day.  Eat dry and bland foods, such as rice or a baked potato. Foods that are high in carbohydrates are often helpful.  Avoid greasy, fatty, and spicy foods.  Have someone cook for you if the smell of any food causes nausea and vomiting.  If you feel nauseous after taking prenatal vitamins, take the vitamins at night or with a snack.  Snack on protein foods between meals if you are hungry. Nuts, yogurt, and cheese are good options.  Drink fluids throughout the day.  Try ginger ale made with real ginger, ginger tea made from fresh grated ginger, or ginger candies. General instructions  Do not use any products that contain nicotine or tobacco, such as  cigarettes and e-cigarettes. If you need help quitting, ask your health care provider.  Get an air purifier to keep the air in your house free of odors.  Get plenty of fresh air.  Try to avoid odors that trigger your nausea.  Consider trying these methods to help relieve symptoms: ? Wearing an acupressure wristband. These wristbands are often worn for seasickness. ? Acupuncture. Contact a  health care provider if:  Your home remedies are not working and you need medicine.  You feel dizzy or light-headed.  You are losing weight. Get help right away if:  You have persistent and uncontrolled nausea and vomiting.  You faint.  You have severe pain in your abdomen. Summary  Morning sickness is when a woman feels nauseous during pregnancy. This nauseous feeling may or may not come with vomiting.  Morning sickness is most common during the first trimester.  It often occurs in the morning, but it can be a problem at any time of day.  In many cases, treatment is not needed for this condition. Making some changes to what you eat may help to control symptoms. This information is not intended to replace advice given to you by your health care provider. Make sure you discuss any questions you have with your health care provider. Document Revised: 11/17/2017 Document Reviewed: 01/07/2017 Elsevier Patient Education  2020 [employer].

## 2020-03-29 NOTE — MAU Note (Signed)
Kristen Phillips is a 34 y.o. at [redacted]w[redacted]d here in MAU reporting: started having nausea and vomiting for 3-4 days, took zofran around 10, and no vomiting since then. Emesis x 10-12 in the past 24 hours. Has a headache. No abdominal pain or bleeding. No discharge.  LMP: 02/08/20 approximately  Onset of complaint: 3-4 days  Pain score: 6/10  Vitals:   03/29/20 1150  BP: 111/71  Pulse: 77  Resp: 16  Temp: 98.8 F (37.1 C)  SpO2: 100%     Lab orders placed from triage: UA, UPT

## 2020-04-06 ENCOUNTER — Encounter (HOSPITAL_COMMUNITY): Payer: Self-pay | Admitting: Family Medicine

## 2020-04-06 ENCOUNTER — Inpatient Hospital Stay (HOSPITAL_COMMUNITY)
Admission: AD | Admit: 2020-04-06 | Discharge: 2020-04-07 | Disposition: A | Discharge status: Home / Self Care | Payer: PRIVATE HEALTH INSURANCE | Attending: Family Medicine | Admitting: Family Medicine

## 2020-04-06 ENCOUNTER — Other Ambulatory Visit: Payer: Self-pay

## 2020-04-06 DIAGNOSIS — O210 Mild hyperemesis gravidarum: Secondary | ICD-10-CM | POA: Diagnosis not present

## 2020-04-06 DIAGNOSIS — O219 Vomiting of pregnancy, unspecified: Secondary | ICD-10-CM

## 2020-04-06 DIAGNOSIS — O09521 Supervision of elderly multigravida, first trimester: Secondary | ICD-10-CM | POA: Diagnosis not present

## 2020-04-06 DIAGNOSIS — Z87891 Personal history of nicotine dependence: Secondary | ICD-10-CM | POA: Insufficient documentation

## 2020-04-06 DIAGNOSIS — Z3A08 8 weeks gestation of pregnancy: Secondary | ICD-10-CM | POA: Diagnosis not present

## 2020-04-06 LAB — URINALYSIS, ROUTINE W REFLEX MICROSCOPIC
Bilirubin Urine: NEGATIVE
Glucose, UA: NEGATIVE mg/dL
Hgb urine dipstick: NEGATIVE
Ketones, ur: 80 mg/dL — AB
Leukocytes,Ua: NEGATIVE
Nitrite: NEGATIVE
Protein, ur: 100 mg/dL — AB
Specific Gravity, Urine: 1.03 (ref 1.005–1.030)
pH: 5 (ref 5.0–8.0)

## 2020-04-06 MED ORDER — PROCHLORPERAZINE MALEATE 10 MG PO TABS: 10.0000 mg | ORAL_TABLET | Freq: Four times a day (QID) | ORAL | 0 refills | Status: DC | PRN

## 2020-04-06 MED ORDER — LACTATED RINGERS IV BOLUS
999.0000 mL | Freq: Once | INTRAVENOUS | Status: AC
  Administered 2020-04-06: 1000 mL via INTRAVENOUS

## 2020-04-06 MED ORDER — LACTATED RINGERS IV BOLUS: 1000.0000 mL | Freq: Once | INTRAVENOUS | Status: DC

## 2020-04-06 MED ORDER — SODIUM CHLORIDE 0.9 % IV SOLN
25.0000 mg | Freq: Once | INTRAVENOUS | Status: AC
  Administered 2020-04-06: 25 mg via INTRAVENOUS
  Filled 2020-04-06: qty 1

## 2020-04-06 MED ORDER — M.V.I. ADULT IV INJ
Freq: Once | INTRAVENOUS | Status: AC
  Filled 2020-04-06: qty 1000

## 2020-04-06 MED ORDER — FAMOTIDINE IN NACL 20-0.9 MG/50ML-% IV SOLN
20.0000 mg | Freq: Once | INTRAVENOUS | Status: AC
  Administered 2020-04-06: 20 mg via INTRAVENOUS
  Filled 2020-04-06: qty 50

## 2020-04-06 MED ORDER — PROCHLORPERAZINE EDISYLATE 10 MG/2ML IJ SOLN
10.0000 mg | Freq: Once | INTRAMUSCULAR | Status: AC
  Administered 2020-04-06: 10 mg via INTRAVENOUS
  Filled 2020-04-06: qty 2

## 2020-04-06 NOTE — MAU Provider Note (Addendum)
History     CSN: [id number]  Arrival date and time: 04/06/20 1533   First Provider Initiated Contact with Patient 04/06/20 2038      Chief Complaint  Patient presents with  . Emesis   Ms.  Kristen Phillips is a 34 y.o. year old [phone] female at [redacted]w[redacted]d weeks gestation who presents to MAU reporting she can't keep anything down off & on for the past 2 wks. She ran out out of Zofran and has not taken anything for N/V. She reports she has been vomiting 10x/day.  She also complains of her "throat being so dry and burning from vomiting so much." She was last seen in MAU for the same complaint 8 days ago.   OB History    Gravida  6   Para  2   Term  1   Preterm  1   AB  3   Living  2     SAB      TAB  3   Ectopic      Multiple      Live Births  1           Past Medical History:  Diagnosis Date  . CNOBSJGG(836.6)     Past Surgical History:  Procedure Laterality Date  . CESAREAN SECTION    . INDUCED ABORTION      Family History  Problem Relation Age of Onset  . Diabetes Mother   . Diabetes Maternal Grandmother   . Diabetes Paternal Grandmother     Social History   Tobacco Use  . Smoking status: Former Smoker    Packs/day: 0.25    Years: 12.00    Pack years: 3.00    Types: Cigarettes    Quit date: 07/13/2013    Years since quitting: 6.7  . Smokeless tobacco: Never Used  Substance Use Topics  . Alcohol use: No  . Drug use: No    Allergies: No Known Allergies  Medications Prior to Admission  Medication Sig Dispense Refill Last Dose  . ondansetron (ZOFRAN) 4 MG tablet Take 4 mg by mouth every 8 (eight) hours as needed for nausea or vomiting.   Past Week at Unknown time  . Prenatal Vit-Fe Fumarate-FA (PRENATAL MULTIVITAMIN) TABS tablet Take 1 tablet by mouth daily at 12 noon.   Past Month at Unknown time  . promethazine (PHENERGAN) 25 MG tablet Take 1 tablet (25 mg total) by mouth every 6 (six) hours as needed for nausea or vomiting. 30 tablet 1  Past Week at Unknown time    Review of Systems  Constitutional: Positive for appetite change and fatigue.  HENT: Positive for sore throat (dry and burning from vomiting so much).   Eyes: Negative.   Respiratory: Negative.   Cardiovascular: Negative.   Gastrointestinal: Negative.   Endocrine: Negative.   Genitourinary: Negative.   Musculoskeletal: Negative.   Skin: Negative.   Allergic/Immunologic: Negative.   Neurological: Negative.   Hematological: Negative.   Psychiatric/Behavioral: Negative.    Physical Exam   Blood pressure 115/80, pulse 91, temperature 98.5 F (36.9 C), resp. rate 16, height 5\' 2"  (1.575 m), weight 63.3 kg, last menstrual period 02/08/2020, SpO2 100 %, unknown if currently breastfeeding.  Physical Exam  Nursing note and vitals reviewed. Constitutional: She is oriented to person, place, and time. She appears well-developed and well-nourished.  HENT:  Head: Normocephalic and atraumatic.  Eyes: Pupils are equal, round, and reactive to light.  Cardiovascular: Normal rate and regular rhythm.  Respiratory: Effort  normal.  GI: Soft.  Genitourinary:    Genitourinary Comments: deferred   Musculoskeletal:        General: Normal range of motion.     Cervical back: Normal range of motion.  Neurological: She is alert and oriented to person, place, and time.  Skin: Skin is warm and dry.  Psychiatric: She has a normal mood and affect. Her behavior is normal. Judgment and thought content normal.   MAU Course  Procedures  MDM IVFs: Phenergan 25 mg in LR 1000 ml @ 999 ml/hr; followed by MVI in LR 1000 ml @ 500 ml/hr -- N/V resolved Pepcid 20 mg IVPB PO Challenge -- patient tolerated well   Results for orders placed or performed during the hospital encounter of 04/06/20 (from the past 24 hour(s))  Urinalysis, Routine w reflex microscopic     Status: Abnormal   Collection Time: 04/06/20  4:50 PM  Result Value Ref Range   Color, Urine AMBER (A) YELLOW    APPearance HAZY (A) CLEAR   Specific Gravity, Urine 1.030 1.005 - 1.030   pH 5.0 5.0 - 8.0   Glucose, UA NEGATIVE NEGATIVE mg/dL   Hgb urine dipstick NEGATIVE NEGATIVE   Bilirubin Urine NEGATIVE NEGATIVE   Ketones, ur 80 (A) NEGATIVE mg/dL   Protein, ur 161 (A) NEGATIVE mg/dL   Nitrite NEGATIVE NEGATIVE   Leukocytes,Ua NEGATIVE NEGATIVE   RBC / HPF 0-5 0 - 5 RBC/hpf   WBC, UA 0-5 0 - 5 WBC/hpf   Bacteria, UA FEW (A) NONE SEEN   Squamous Epithelial / LPF 0-5 0 - 5   Mucus PRESENT     Report given to and care assumed by Steward Drone, CNM @ 2100  Raelyn Mora, MSN, CNM 04/06/2020, 8:39 PM   Care taken over and introduced self to patient  Patient actively vomiting and requesting additional medication  Compazine 10mg  IV ordered and additional LR bolus    PO challenge initiated after medication, patient able to keep down saltines and sprite prior to discharge home.  Discussed reasons to return to MAU. Follow up as scheduled in the office. Return to MAU as needed. Pt stable at time of discharge.  Rx for zofran refill and compazine sent to pharmacy on file.   Assessment and Plan   1. Nausea and vomiting during pregnancy prior to [redacted] weeks gestation   2. [redacted] weeks gestation of pregnancy    Discharge home Make initial prenatal visit for care  Return to MAU as needed for reasons discussed and/or emergencies  Rx for Zofran and compazine   Follow-up Information    [facility] Follow up.   Specialty: Obstetrics and Gynecology Contact information: [address] [id number] [place] [phone]         Allergies as of 04/07/2020   No Known Allergies     Medication List    STOP taking these medications   promethazine 25 MG tablet Commonly known as: PHENERGAN     TAKE these medications   ondansetron 4 MG tablet Commonly known as: ZOFRAN Take 1 tablet (4 mg total) by mouth every 8 (eight) hours as needed for nausea  or vomiting.   prenatal multivitamin Tabs tablet Take 1 tablet by mouth daily at 12 noon.   prochlorperazine 10 MG tablet Commonly known as: COMPAZINE Take 1 tablet (10 mg total) by mouth every 6 (six) hours as needed for nausea or vomiting.      04/09/2020, CNM 04/07/20,  12:11 AM

## 2020-04-06 NOTE — MAU Note (Signed)
Kristen Phillips is a 34 y.o. at [redacted]w[redacted]d here in MAU reporting: states she cannot keep anything down. Has been going on off and on for a couple weeks. States she ran out of her zofran. Emesis x 10 in the past 24 hours.  Onset of complaint: ongoing  Pain score: 0/10  Vitals:   04/06/20 1620  BP: 115/80  Pulse: 91  Resp: 16  Temp: 99.1 F (37.3 C)  SpO2: 100%     Lab orders placed from triage: UA

## 2020-04-06 NOTE — MAU Note (Signed)
Patients husband stepped out and stated the patient started throwing up when the new bags of medication started. Provider to be notified. Medications clamped for now.

## 2020-04-07 MED ORDER — ONDANSETRON HCL 4 MG PO TABS: 4.0000 mg | ORAL_TABLET | Freq: Three times a day (TID) | ORAL | 3 refills | Status: DC | PRN

## 2020-04-13 ENCOUNTER — Encounter (HOSPITAL_COMMUNITY): Payer: Self-pay | Admitting: Obstetrics & Gynecology

## 2020-04-13 ENCOUNTER — Inpatient Hospital Stay (HOSPITAL_COMMUNITY): Payer: No Typology Code available for payment source

## 2020-04-13 ENCOUNTER — Inpatient Hospital Stay (HOSPITAL_COMMUNITY)
Admission: AD | Admit: 2020-04-13 | Discharge: 2020-04-16 | DRG: 832 | Disposition: A | Discharge status: Home / Self Care | Payer: No Typology Code available for payment source | Attending: Obstetrics & Gynecology | Admitting: Obstetrics & Gynecology

## 2020-04-13 ENCOUNTER — Other Ambulatory Visit: Payer: Self-pay

## 2020-04-13 DIAGNOSIS — Z3A01 Less than 8 weeks gestation of pregnancy: Secondary | ICD-10-CM | POA: Diagnosis not present

## 2020-04-13 DIAGNOSIS — O210 Mild hyperemesis gravidarum: Secondary | ICD-10-CM | POA: Diagnosis present

## 2020-04-13 DIAGNOSIS — Z87891 Personal history of nicotine dependence: Secondary | ICD-10-CM | POA: Diagnosis not present

## 2020-04-13 DIAGNOSIS — F1290 Cannabis use, unspecified, uncomplicated: Secondary | ICD-10-CM | POA: Diagnosis present

## 2020-04-13 DIAGNOSIS — O211 Hyperemesis gravidarum with metabolic disturbance: Principal | ICD-10-CM | POA: Diagnosis present

## 2020-04-13 DIAGNOSIS — Z3A09 9 weeks gestation of pregnancy: Secondary | ICD-10-CM

## 2020-04-13 DIAGNOSIS — Z3491 Encounter for supervision of normal pregnancy, unspecified, first trimester: Secondary | ICD-10-CM

## 2020-04-13 DIAGNOSIS — R748 Abnormal levels of other serum enzymes: Secondary | ICD-10-CM

## 2020-04-13 DIAGNOSIS — O219 Vomiting of pregnancy, unspecified: Secondary | ICD-10-CM

## 2020-04-13 DIAGNOSIS — E875 Hyperkalemia: Secondary | ICD-10-CM

## 2020-04-13 DIAGNOSIS — O99321 Drug use complicating pregnancy, first trimester: Secondary | ICD-10-CM | POA: Diagnosis present

## 2020-04-13 DIAGNOSIS — E860 Dehydration: Secondary | ICD-10-CM

## 2020-04-13 LAB — CBC
HCT: 46.5 % — ABNORMAL HIGH (ref 36.0–46.0)
Hemoglobin: 15.5 g/dL — ABNORMAL HIGH (ref 12.0–15.0)
MCH: 33.1 pg (ref 26.0–34.0)
MCHC: 33.3 g/dL (ref 30.0–36.0)
MCV: 99.4 fL (ref 80.0–100.0)
Platelets: 313 10*3/uL (ref 150–400)
RBC: 4.68 MIL/uL (ref 3.87–5.11)
RDW: 11.9 % (ref 11.5–15.5)
WBC: 13.2 10*3/uL — ABNORMAL HIGH (ref 4.0–10.5)
nRBC: 0 % (ref 0.0–0.2)

## 2020-04-13 LAB — COMPREHENSIVE METABOLIC PANEL
ALT: 99 U/L — ABNORMAL HIGH (ref 0–44)
AST: 64 U/L — ABNORMAL HIGH (ref 15–41)
Albumin: 4.7 g/dL (ref 3.5–5.0)
Alkaline Phosphatase: 71 U/L (ref 38–126)
Anion gap: 19 — ABNORMAL HIGH (ref 5–15)
BUN: 16 mg/dL (ref 6–20)
CO2: 15 mmol/L — ABNORMAL LOW (ref 22–32)
Calcium: 10.1 mg/dL (ref 8.9–10.3)
Chloride: 105 mmol/L (ref 98–111)
Creatinine, Ser: 1.03 mg/dL — ABNORMAL HIGH (ref 0.44–1.00)
GFR calc Af Amer: >60 mL/min (ref >60)
GFR calc non Af Amer: >60 mL/min (ref >60)
Glucose, Bld: 124 mg/dL — ABNORMAL HIGH (ref 70–99)
Potassium: 3.3 mmol/L — ABNORMAL LOW (ref 3.5–5.1)
Sodium: 139 mmol/L (ref 135–145)
Total Bilirubin: 2.1 mg/dL — ABNORMAL HIGH (ref 0.3–1.2)
Total Protein: 7.9 g/dL (ref 6.5–8.1)

## 2020-04-13 LAB — RAPID URINE DRUG SCREEN, HOSP PERFORMED
Amphetamines: NOT DETECTED
Barbiturates: NOT DETECTED
Benzodiazepines: NOT DETECTED
Cocaine: NOT DETECTED
Opiates: NOT DETECTED
Tetrahydrocannabinol: POSITIVE — AB

## 2020-04-13 LAB — ABO/RH: ABO/RH(D): O POS

## 2020-04-13 LAB — URINALYSIS, ROUTINE W REFLEX MICROSCOPIC
Bacteria, UA: NONE SEEN
Bilirubin Urine: NEGATIVE
Glucose, UA: NEGATIVE mg/dL
Ketones, ur: 80 mg/dL — AB
Leukocytes,Ua: NEGATIVE
Nitrite: NEGATIVE
Protein, ur: 100 mg/dL — AB
Specific Gravity, Urine: 1.02 (ref 1.005–1.030)
pH: 5 (ref 5.0–8.0)

## 2020-04-13 LAB — TSH: TSH: 0.632 u[IU]/mL (ref 0.350–4.500)

## 2020-04-13 LAB — TYPE AND SCREEN
ABO/RH(D): O POS
Antibody Screen: NEGATIVE

## 2020-04-13 MED ORDER — FAMOTIDINE IN NACL 20-0.9 MG/50ML-% IV SOLN
20.0000 mg | Freq: Once | INTRAVENOUS | Status: AC
  Administered 2020-04-13: 10:00:00 20 mg via INTRAVENOUS
  Filled 2020-04-13: qty 50

## 2020-04-13 MED ORDER — LACTATED RINGERS IV BOLUS
1000.0000 mL | Freq: Once | INTRAVENOUS | Status: AC
  Administered 2020-04-13: 1000 mL via INTRAVENOUS

## 2020-04-13 MED ORDER — METHYLPREDNISOLONE 4 MG PO TABS: 4.0000 mg | ORAL_TABLET | Freq: Every day | ORAL | Status: DC

## 2020-04-13 MED ORDER — SODIUM CHLORIDE 0.9 % IV SOLN: INTRAVENOUS | Status: DC

## 2020-04-13 MED ORDER — POTASSIUM CHLORIDE 10 MEQ/100ML IV SOLN
10.0000 meq | INTRAVENOUS | Status: AC
  Administered 2020-04-13 (×3): 10 meq via INTRAVENOUS
  Filled 2020-04-13 (×3): qty 100

## 2020-04-13 MED ORDER — METHYLPREDNISOLONE 16 MG PO TABS
16.0000 mg | ORAL_TABLET | Freq: Every day | ORAL | Status: DC
  Filled 2020-04-13: qty 1

## 2020-04-13 MED ORDER — METHYLPREDNISOLONE SODIUM SUCC 125 MG IJ SOLR
48.0000 mg | Freq: Once | INTRAMUSCULAR | Status: AC
  Administered 2020-04-13: 48 mg via INTRAVENOUS
  Filled 2020-04-13: qty 2

## 2020-04-13 MED ORDER — M.V.I. ADULT IV INJ
Freq: Once | INTRAVENOUS | Status: DC
  Filled 2020-04-13: qty 1000

## 2020-04-13 MED ORDER — SCOPOLAMINE 1 MG/3DAYS TD PT72
1.0000 | MEDICATED_PATCH | TRANSDERMAL | Status: DC
  Administered 2020-04-13: 09:00:00 1.5 mg via TRANSDERMAL
  Filled 2020-04-13 (×2): qty 1

## 2020-04-13 MED ORDER — METHYLPREDNISOLONE 4 MG PO TABS: 8.0000 mg | ORAL_TABLET | Freq: Every day | ORAL | Status: DC

## 2020-04-13 MED ORDER — GLYCOPYRROLATE 0.2 MG/ML IJ SOLN
0.3000 mg | Freq: Three times a day (TID) | INTRAMUSCULAR | Status: DC
  Administered 2020-04-13 – 2020-04-16 (×8): 0.3 mg via INTRAVENOUS
  Filled 2020-04-13 (×8): qty 2

## 2020-04-13 MED ORDER — SODIUM CHLORIDE 0.9 % IV SOLN
INTRAVENOUS | Status: DC | PRN
  Administered 2020-04-13: 500 mL via INTRAVENOUS

## 2020-04-13 MED ORDER — PROMETHAZINE HCL 25 MG/ML IJ SOLN
25.0000 mg | Freq: Four times a day (QID) | INTRAMUSCULAR | Status: DC
  Administered 2020-04-13 – 2020-04-16 (×11): 25 mg via INTRAVENOUS
  Filled 2020-04-13 (×12): qty 1

## 2020-04-13 MED ORDER — ACETAMINOPHEN 325 MG PO TABS: 650.0000 mg | ORAL_TABLET | ORAL | Status: DC | PRN

## 2020-04-13 MED ORDER — DOCUSATE SODIUM 100 MG PO CAPS
100.0000 mg | ORAL_CAPSULE | Freq: Every day | ORAL | Status: DC
  Administered 2020-04-16: 100 mg via ORAL
  Filled 2020-04-13: qty 1

## 2020-04-13 MED ORDER — SODIUM CHLORIDE 0.9 % IV SOLN
25.0000 mg | Freq: Once | INTRAVENOUS | Status: AC
  Administered 2020-04-13: 25 mg via INTRAVENOUS
  Filled 2020-04-13: qty 1

## 2020-04-13 MED ORDER — ZOLPIDEM TARTRATE 5 MG PO TABS: 5.0000 mg | ORAL_TABLET | Freq: Every evening | ORAL | Status: DC | PRN

## 2020-04-13 MED ORDER — SODIUM CHLORIDE 0.9 % IV SOLN
8.0000 mg | Freq: Once | INTRAVENOUS | Status: AC
  Administered 2020-04-13: 8 mg via INTRAVENOUS
  Filled 2020-04-13: qty 4

## 2020-04-13 MED ORDER — PRENATAL MULTIVITAMIN CH: 1.0000 | ORAL_TABLET | Freq: Every day | ORAL | Status: DC

## 2020-04-13 MED ORDER — CALCIUM CARBONATE ANTACID 500 MG PO CHEW: 2.0000 | CHEWABLE_TABLET | ORAL | Status: DC | PRN

## 2020-04-13 MED ORDER — METHYLPREDNISOLONE 16 MG PO TABS: 16.0000 mg | ORAL_TABLET | Freq: Every day | ORAL | Status: DC

## 2020-04-13 NOTE — MAU Provider Note (Signed)
History     CSN: [id number]  Arrival date and time: 04/13/20 0748   None     Chief Complaint  Patient presents with  . Morning Sickness   HPI   Kristen Phillips is a 34 y.o. female [phone] @ [redacted]w[redacted]d here with continued  hyperemesis symptoms. States she is thowing up all day long. She is taking compazine, phenergan,  and Zofran. States she is taking different Zofran tablets than those that were prescribed to her "disolving tablets".  She says she was given Zofran but it does not make her feel the same as the Zofran that her "friend" gave her. Reports almost a 20 lb weight loss since beginning of pregnancy. The nausea is constant. She reports spitting continuously.   Reports she has tried protein shakes, apple sauce, toast. And it all comes up and makes her feel worse.  Pre- pregnancy weight was 66 kg  Attests to marijuana use: 1-2 x weekly.   4 MAU visits with the pregnancy.   OB History    Gravida  6   Para  2   Term  1   Preterm  1   AB  3   Living  2     SAB      TAB  3   Ectopic      Multiple      Live Births  1              Past Medical History:  Diagnosis Date  . [id number](390.3)     Past Surgical History:  Procedure Laterality Date  . CESAREAN SECTION    . INDUCED ABORTION      Family History  Problem Relation Age of Onset  . Diabetes Mother   . Diabetes Maternal Grandmother   . Diabetes Paternal Grandmother     Social History   Tobacco Use  . Smoking status: Former Smoker    Packs/day: 0.25    Years: 12.00    Pack years: 3.00    Types: Cigarettes    Quit date: 07/13/2013    Years since quitting: 6.7  . Smokeless tobacco: Never Used  Substance Use Topics  . Alcohol use: No  . Drug use: No    Allergies: No Known Allergies  Medications Prior to Admission  Medication Sig Dispense Refill Last Dose  . Prenatal Vit-Fe Fumarate-FA (PRENATAL MULTIVITAMIN) TABS tablet Take 1 tablet by mouth daily at 12 noon.   Past Month at  Unknown time  . ondansetron (ZOFRAN) 4 MG tablet Take 1 tablet (4 mg total) by mouth every 8 (eight) hours as needed for nausea or vomiting. 30 tablet 3 states was not given a perscription  . prochlorperazine (COMPAZINE) 10 MG tablet Take 1 tablet (10 mg total) by mouth every 6 (six) hours as needed for nausea or vomiting. 30 tablet 0 not taken   Results for orders placed or performed during the hospital encounter of 04/13/20 (from the past 48 hour(s))  Urinalysis, Routine w reflex microscopic     Status: Abnormal   Collection Time: 04/13/20  8:00 AM  Result Value Ref Range   Color, Urine AMBER (A) YELLOW    Comment: BIOCHEMICALS MAY BE AFFECTED BY COLOR   APPearance HAZY (A) CLEAR   Specific Gravity, Urine 1.020 1.005 - 1.030   pH 5.0 5.0 - 8.0   Glucose, UA NEGATIVE NEGATIVE mg/dL   Hgb urine dipstick SMALL (A) NEGATIVE   Bilirubin Urine NEGATIVE NEGATIVE   Ketones, ur 80 (A) NEGATIVE  mg/dL   Protein, ur 270 (A) NEGATIVE mg/dL   Nitrite NEGATIVE NEGATIVE   Leukocytes,Ua NEGATIVE NEGATIVE   RBC / HPF 6-10 0 - 5 RBC/hpf   WBC, UA 6-10 0 - 5 WBC/hpf   Bacteria, UA NONE SEEN NONE SEEN   Squamous Epithelial / LPF 6-10 0 - 5   Mucus PRESENT    Hyaline Casts, UA PRESENT     Comment: Performed at [facility], [address] [place], [place] [address]  Comprehensive metabolic panel     Status: Abnormal   Collection Time: 04/13/20  9:31 AM  Result Value Ref Range   Sodium 139 135 - 145 mmol/L   Potassium 3.3 (L) 3.5 - 5.1 mmol/L   Chloride 105 98 - 111 mmol/L   CO2 15 (L) 22 - 32 mmol/L   Glucose, Bld 124 (H) 70 - 99 mg/dL    Comment: Glucose reference range applies only to samples taken after fasting for at least 8 hours.   BUN 16 6 - 20 mg/dL   Creatinine, Ser 2.83 (H) 0.44 - 1.00 mg/dL   Calcium 15.1 8.9 - 76.1 mg/dL   Total Protein 7.9 6.5 - 8.1 g/dL   Albumin 4.7 3.5 - 5.0 g/dL   AST 64 (H) 15 - 41 U/L   ALT 99 (H) 0 - 44 U/L   Alkaline Phosphatase 71 38 - 126 U/L    Total Bilirubin 2.1 (H) 0.3 - 1.2 mg/dL   GFR calc non Af Amer >60 >60 mL/min   GFR calc Af Amer >60 >60 mL/min   Anion gap 19 (H) 5 - 15    Comment: Performed at [facility], [address] [place], [place] [address]  CBC     Status: Abnormal   Collection Time: 04/13/20  9:31 AM  Result Value Ref Range   WBC 13.2 (H) 4.0 - 10.5 K/uL   RBC 4.68 3.87 - 5.11 MIL/uL   Hemoglobin 15.5 (H) 12.0 - 15.0 g/dL   HCT 10.6 (H) 26.9 - 48.5 %   MCV 99.4 80.0 - 100.0 fL   MCH 33.1 26.0 - 34.0 pg   MCHC 33.3 30.0 - 36.0 g/dL   RDW 46.2 70.3 - 50.0 %   Platelets 313 150 - 400 K/uL   nRBC 0.0 0.0 - 0.2 %    Comment: Performed at [facility], [address], [place], [place] [address]  Type and screen [facility]     Status: None   Collection Time: 04/13/20  9:31 AM  Result Value Ref Range   ABO/RH(D) O POS    Antibody Screen NEG    Sample Expiration      04/16/2020,2359 Performed at [facility], [address], [place], [place] [address]   TSH     Status: None   Collection Time: 04/13/20  9:31 AM  Result Value Ref Range   TSH 0.632 0.350 - 4.500 uIU/mL    Comment: Performed by a 3rd Generation assay with a functional sensitivity of <=0.01 uIU/mL. Performed at [facility], [address], [place], [place] [address]    Review of Systems  Constitutional: Negative for fever.  Gastrointestinal: Positive for nausea and vomiting. Negative for abdominal pain.  Genitourinary: Negative for vaginal bleeding and vaginal discharge.   Physical Exam   Blood pressure 116/83, pulse (!) 107, temperature 97.9 F (36.6 C), resp. rate 18, weight 59 kg, last menstrual period 02/08/2020, SpO2 100 %,  unknown if currently breastfeeding.  Physical Exam  Constitutional: She is oriented to person, place, and time. She appears well-developed and well-nourished. She has a sickly appearance. She appears ill. No distress.  Respiratory: Effort normal.  Musculoskeletal:         General: Normal range of motion.  Neurological: She is alert and oriented to person, place, and time.  Skin: Skin is warm. She is not diaphoretic.  Psychiatric: Her behavior is normal.   MAU Course  Procedures  None  MDM  CBC, CMP, urine drug screen, TSH UA shows severe dehydration.  LR bolus X 2 Patient refused MVI 11:00: Patient activilty vomiting crackers after attempting to eat them.  Discussed labs with Dr. Macon Large, reviewed labs, [place], and HPI> will admit for fluid resuscitation. K+ 3.3, 10 meq of K+ ordered x 3 doses Phenergan IV 250 mg/ hour  Assessment and Plan   A:  1. Hyperemesis complicating pregnancy, antepartum   2. Nausea and vomiting in pregnancy   3. [redacted] weeks gestation of pregnancy   4. Normal obstetric ultrasound scan in first trimester     P:  Admit to [facility] Dr. Nonah Mattes to resume care  Athony Coppa, Harolyn Rutherford, NP 04/13/2020 12:19 PM

## 2020-04-13 NOTE — Plan of Care (Signed)
  Problem: Bowel/Gastric: Goal: Occurences of nausea and/or vomiting will decrease Outcome: Progressing   Problem: Education: Goal: Knowledge of the prescribed therapeutic regimen will improve Outcome: Progressing

## 2020-04-13 NOTE — MAU Note (Signed)
.   Kristen Phillips is a 34 y.o. at [redacted]w[redacted]d here in MAU reporting:morning sickness for weeks. Denies any pain. States meds given for nausea is not helping LMP: 02/08/20 Onset of complaint: ongoing weeks Pain score: 0 Vitals:   04/13/20 0807  BP: 116/83  Pulse: (!) 107  Resp: 18  Temp: 97.9 F (36.6 C)  SpO2: 100%     FHT: Lab orders placed from triage: UA

## 2020-04-13 NOTE — Progress Notes (Signed)
Pharmacy Consult:   MEDROL (METHYLPREDNISOLONE) TAPER  FOR HYPEREMESIS GRAVIDARUM PATIENTS  The following is a 14 day taper of methylprednisolone for hyperemesis. Doses on day 1  will be given IV.  All doses starting on day 2  will be given PO. (If patient cannot tolerate oral medications, contact the pharmacy to change route to IV.)   Date Day Morning Midday Bedtime  04/13/2020 1  48 mg   04/14/2020 2 16 mg 16 mg 16 mg  04/15/2020 3 16 mg 16 mg 16 mg  04/16/2020 4 16 mg 8 mg 16 mg  04/17/2020 5 16 mg 8 mg 8 mg  04/18/2020 6 8 mg 8 mg 8 mg  04/19/2020 7 8 mg 4 mg 8 mg  04/20/2020 8 8 mg 4 mg 4 mg  04/21/2020 9 8 mg 4 mg   04/22/2020 10 8 mg 4 mg   04/23/2020 11 8 mg    04/24/2020 12 8 mg    04/25/2020 13 4 mg    5/92021 14 4 mg     Check fasting blood sugars daily while on the taper. Notify MD if fasting blood sugar>95.  Cherlyn Cushing, PharmD 04/13/2020

## 2020-04-13 NOTE — H&P (Signed)
History     CSN: [id number]  Arrival date and time: 04/13/20 0748   None     Chief Complaint  Patient presents with  . Morning Sickness   HPI   [name] is a 34 y.o. female [phone] @ [redacted]w[redacted]d here with continued  hyperemesis symptoms. States she is thowing up all day long. She is taking compazine, phenergan,  and Zofran. States she is taking different Zofran tablets than those that were prescribed to her "disolving tablets".  She says she was given Zofran but it does not make her feel the same as the Zofran that her "friend" gave her. Reports almost a 20 lb weight loss since beginning of pregnancy. The nausea is constant. She reports spitting continuously.   Reports she has tried protein shakes, apple sauce, toast. And it all comes up and makes her feel worse.  Pre- pregnancy weight was 66 kg  Attests to marijuana use: 1-2 x weekly.   4 MAU visits with the pregnancy.   OB History    Gravida  6   Para  2   Term  1   Preterm  1   AB  3   Living  2     SAB      TAB  3   Ectopic      Multiple      Live Births  1              Past Medical History:  Diagnosis Date  . KDTOIZTI(458.0)     Past Surgical History:  Procedure Laterality Date  . CESAREAN SECTION    . INDUCED ABORTION      Family History  Problem Relation Age of Onset  . Diabetes Mother   . Diabetes Maternal Grandmother   . Diabetes Paternal Grandmother     Social History   Tobacco Use  . Smoking status: Former Smoker    Packs/day: 0.25    Years: 12.00    Pack years: 3.00    Types: Cigarettes    Quit date: 07/13/2013    Years since quitting: 6.7  . Smokeless tobacco: Never Used  Substance Use Topics  . Alcohol use: No  . Drug use: No    Allergies: No Known Allergies  Medications Prior to Admission  Medication Sig Dispense Refill Last Dose  . Prenatal Vit-Fe Fumarate-FA (PRENATAL MULTIVITAMIN) TABS tablet Take 1 tablet by mouth daily at 12 noon.   Past Month at  Unknown time  . ondansetron (ZOFRAN) 4 MG tablet Take 1 tablet (4 mg total) by mouth every 8 (eight) hours as needed for nausea or vomiting. 30 tablet 3 states was not given a perscription  . prochlorperazine (COMPAZINE) 10 MG tablet Take 1 tablet (10 mg total) by mouth every 6 (six) hours as needed for nausea or vomiting. 30 tablet 0 not taken   Results for orders placed or performed during the hospital encounter of 04/13/20 (from the past 48 hour(s))  Urinalysis, Routine w reflex microscopic     Status: Abnormal   Collection Time: 04/13/20  8:00 AM  Result Value Ref Range   Color, Urine AMBER (A) YELLOW    Comment: BIOCHEMICALS MAY BE AFFECTED BY COLOR   APPearance HAZY (A) CLEAR   Specific Gravity, Urine 1.020 1.005 - 1.030   pH 5.0 5.0 - 8.0   Glucose, UA NEGATIVE NEGATIVE mg/dL   Hgb urine dipstick SMALL (A) NEGATIVE   Bilirubin Urine NEGATIVE NEGATIVE   Ketones, ur 80 (A) NEGATIVE  mg/dL   Protein, ur 270 (A) NEGATIVE mg/dL   Nitrite NEGATIVE NEGATIVE   Leukocytes,Ua NEGATIVE NEGATIVE   RBC / HPF 6-10 0 - 5 RBC/hpf   WBC, UA 6-10 0 - 5 WBC/hpf   Bacteria, UA NONE SEEN NONE SEEN   Squamous Epithelial / LPF 6-10 0 - 5   Mucus PRESENT    Hyaline Casts, UA PRESENT     Comment: Performed at [facility], [address] [place], [place] [address]  Comprehensive metabolic panel     Status: Abnormal   Collection Time: 04/13/20  9:31 AM  Result Value Ref Range   Sodium 139 135 - 145 mmol/L   Potassium 3.3 (L) 3.5 - 5.1 mmol/L   Chloride 105 98 - 111 mmol/L   CO2 15 (L) 22 - 32 mmol/L   Glucose, Bld 124 (H) 70 - 99 mg/dL    Comment: Glucose reference range applies only to samples taken after fasting for at least 8 hours.   BUN 16 6 - 20 mg/dL   Creatinine, Ser 2.83 (H) 0.44 - 1.00 mg/dL   Calcium 15.1 8.9 - 76.1 mg/dL   Total Protein 7.9 6.5 - 8.1 g/dL   Albumin 4.7 3.5 - 5.0 g/dL   AST 64 (H) 15 - 41 U/L   ALT 99 (H) 0 - 44 U/L   Alkaline Phosphatase 71 38 - 126 U/L    Total Bilirubin 2.1 (H) 0.3 - 1.2 mg/dL   GFR calc non Af Amer >60 >60 mL/min   GFR calc Af Amer >60 >60 mL/min   Anion gap 19 (H) 5 - 15    Comment: Performed at [facility], [address] [place], [place] [address]  CBC     Status: Abnormal   Collection Time: 04/13/20  9:31 AM  Result Value Ref Range   WBC 13.2 (H) 4.0 - 10.5 K/uL   RBC 4.68 3.87 - 5.11 MIL/uL   Hemoglobin 15.5 (H) 12.0 - 15.0 g/dL   HCT 10.6 (H) 26.9 - 48.5 %   MCV 99.4 80.0 - 100.0 fL   MCH 33.1 26.0 - 34.0 pg   MCHC 33.3 30.0 - 36.0 g/dL   RDW 46.2 70.3 - 50.0 %   Platelets 313 150 - 400 K/uL   nRBC 0.0 0.0 - 0.2 %    Comment: Performed at [facility], [address], [place], [place] [address]  Type and screen [facility]     Status: None   Collection Time: 04/13/20  9:31 AM  Result Value Ref Range   ABO/RH(D) O POS    Antibody Screen NEG    Sample Expiration      04/16/2020,2359 Performed at [facility], [address], [place], [place] [address]   TSH     Status: None   Collection Time: 04/13/20  9:31 AM  Result Value Ref Range   TSH 0.632 0.350 - 4.500 uIU/mL    Comment: Performed by a 3rd Generation assay with a functional sensitivity of <=0.01 uIU/mL. Performed at [facility], [address], [place], [place] [address]    Review of Systems  Constitutional: Negative for fever.  Gastrointestinal: Positive for nausea and vomiting. Negative for abdominal pain.  Genitourinary: Negative for vaginal bleeding and vaginal discharge.   Physical Exam   Blood pressure 116/83, pulse (!) 107, temperature 97.9 F (36.6 C), resp. rate 18, weight 59 kg, last menstrual period 02/08/2020, SpO2 100 %,  unknown if currently breastfeeding.  Physical Exam  Constitutional: She is oriented to person, place, and time. She appears well-developed and well-nourished. She has a sickly appearance. She appears ill. No distress.  Respiratory: Effort normal.  Musculoskeletal:         General: Normal range of motion.  Neurological: She is alert and oriented to person, place, and time.  Skin: Skin is warm. She is not diaphoretic.  Psychiatric: Her behavior is normal.   MAU Course  Procedures  None  Assessment and Plan   A:  1. Hyperemesis complicating pregnancy, antepartum   2. Nausea and vomiting in pregnancy   3. [redacted] weeks gestation of pregnancy   4. Normal obstetric ultrasound scan in first trimester   5. Hyperkalemia   6. Severe dehydration   7. Elevated liver enzymes     P:  Admit to [facility] for fluid resuscitation  K+ for hyperkalemia.  Dr. Nonah Mattes to resume care  Larisha Vencill, Harolyn Rutherford, NP 04/13/2020 12:19 PM

## 2020-04-14 DIAGNOSIS — O210 Mild hyperemesis gravidarum: Secondary | ICD-10-CM

## 2020-04-14 LAB — COMPREHENSIVE METABOLIC PANEL
ALT: 89 U/L — ABNORMAL HIGH (ref 0–44)
AST: 49 U/L — ABNORMAL HIGH (ref 15–41)
Albumin: 3.4 g/dL — ABNORMAL LOW (ref 3.5–5.0)
Alkaline Phosphatase: 53 U/L (ref 38–126)
Anion gap: 12 (ref 5–15)
BUN: 7 mg/dL (ref 6–20)
CO2: 16 mmol/L — ABNORMAL LOW (ref 22–32)
Calcium: 8.8 mg/dL — ABNORMAL LOW (ref 8.9–10.3)
Chloride: 114 mmol/L — ABNORMAL HIGH (ref 98–111)
Creatinine, Ser: 0.64 mg/dL (ref 0.44–1.00)
GFR calc Af Amer: >60 mL/min (ref >60)
GFR calc non Af Amer: >60 mL/min (ref >60)
Glucose, Bld: 77 mg/dL (ref 70–99)
Potassium: 3.6 mmol/L (ref 3.5–5.1)
Sodium: 142 mmol/L (ref 135–145)
Total Bilirubin: 1.7 mg/dL — ABNORMAL HIGH (ref 0.3–1.2)
Total Protein: 5.9 g/dL — ABNORMAL LOW (ref 6.5–8.1)

## 2020-04-14 LAB — CBC WITH DIFFERENTIAL/PLATELET
Abs Immature Granulocytes: 0.08 10*3/uL — ABNORMAL HIGH (ref 0.00–0.07)
Basophils Absolute: 0.1 10*3/uL (ref 0.0–0.1)
Basophils Relative: 0 %
Eosinophils Absolute: 0 10*3/uL (ref 0.0–0.5)
Eosinophils Relative: 0 %
HCT: 36.1 % (ref 36.0–46.0)
Hemoglobin: 12.2 g/dL (ref 12.0–15.0)
Immature Granulocytes: 1 %
Lymphocytes Relative: 15 %
Lymphs Abs: 2.5 10*3/uL (ref 0.7–4.0)
MCH: 33.9 pg (ref 26.0–34.0)
MCHC: 33.8 g/dL (ref 30.0–36.0)
MCV: 100.3 fL — ABNORMAL HIGH (ref 80.0–100.0)
Monocytes Absolute: 1.4 10*3/uL — ABNORMAL HIGH (ref 0.1–1.0)
Monocytes Relative: 8 %
Neutro Abs: 12.8 10*3/uL — ABNORMAL HIGH (ref 1.7–7.7)
Neutrophils Relative %: 76 %
Platelets: 252 10*3/uL (ref 150–400)
RBC: 3.6 MIL/uL — ABNORMAL LOW (ref 3.87–5.11)
RDW: 12 % (ref 11.5–15.5)
WBC: 16.7 10*3/uL — ABNORMAL HIGH (ref 4.0–10.5)
nRBC: 0 % (ref 0.0–0.2)

## 2020-04-14 LAB — MAGNESIUM: Magnesium: 2.1 mg/dL (ref 1.7–2.4)

## 2020-04-14 LAB — GLUCOSE, CAPILLARY: Glucose-Capillary: 73 mg/dL (ref 70–99)

## 2020-04-14 LAB — PHOSPHORUS: Phosphorus: 2.5 mg/dL (ref 2.5–4.6)

## 2020-04-14 MED ORDER — ONDANSETRON HCL 4 MG/2ML IJ SOLN
4.0000 mg | Freq: Once | INTRAMUSCULAR | Status: AC
  Administered 2020-04-14: 20:00:00 4 mg via INTRAVENOUS
  Filled 2020-04-14: qty 2

## 2020-04-14 MED ORDER — METHYLPREDNISOLONE SODIUM SUCC 40 MG IJ SOLR
8.0000 mg | Freq: Every day | INTRAMUSCULAR | Status: DC
  Filled 2020-04-14: qty 0.2

## 2020-04-14 MED ORDER — PANTOPRAZOLE SODIUM 40 MG IV SOLR
40.0000 mg | Freq: Two times a day (BID) | INTRAVENOUS | Status: DC
  Administered 2020-04-14 – 2020-04-16 (×4): 40 mg via INTRAVENOUS
  Filled 2020-04-14 (×4): qty 40

## 2020-04-14 MED ORDER — METHYLPREDNISOLONE SODIUM SUCC 40 MG IJ SOLR
16.0000 mg | Freq: Every day | INTRAMUSCULAR | Status: DC
  Administered 2020-04-14 – 2020-04-16 (×3): 16 mg via INTRAVENOUS
  Filled 2020-04-14 (×3): qty 0.4

## 2020-04-14 MED ORDER — METHYLPREDNISOLONE SODIUM SUCC 40 MG IJ SOLR
16.0000 mg | Freq: Every day | INTRAMUSCULAR | Status: DC
  Administered 2020-04-14 – 2020-04-16 (×2): 16 mg via INTRAVENOUS
  Filled 2020-04-14 (×2): qty 0.4

## 2020-04-14 MED ORDER — METHYLPREDNISOLONE SODIUM SUCC 40 MG IJ SOLR
16.0000 mg | Freq: Every day | INTRAMUSCULAR | Status: AC
  Administered 2020-04-14 – 2020-04-15 (×2): 16 mg via INTRAVENOUS
  Filled 2020-04-14 (×2): qty 0.4

## 2020-04-14 NOTE — Progress Notes (Signed)
FACULTY PRACTICE ANTEPARTUM COMPREHENSIVE PROGRESS NOTE  Lynsee A Mells is a 34 y.o. [id number] at [redacted]w[redacted]d who is admitted for hyperemesis.  Estimated Date of Delivery: 11/14/20 Fetal presentation is unsure.  Length of Stay:  1 Days. Admitted 04/13/2020  Subjective: Patient reports weakness and fatigue.  She vomited once this morning with clear fluids.  She is ambulating and voiding.  She had her last bowel movement 2 days ago.  She reports no uterine contractions, no bleeding and no loss of fluid per vagina.  She has no calf pain or edema.  Vitals:  Blood pressure (!) 107/52, pulse 92, temperature 98.1 F (36.7 C), temperature source Oral, resp. rate 16, height 5\' 2"  (1.575 m), weight 59 kg, last menstrual period 02/08/2020, SpO2 100 %, unknown if currently breastfeeding. Physical Examination: CONSTITUTIONAL: Well-developed, well-nourished female in no acute distress.  HENT:  Normocephalic, atraumatic, External right and left ear normal. Oropharynx is clear and moist EYES: Conjunctivae and EOM are normal. Pupils are equal, round, and reactive to light. No scleral icterus.  NECK: Normal range of motion, supple, no masses SKIN: Skin is warm and dry. No rash noted. Not diaphoretic. No erythema. No pallor. NEUROLGIC: Alert and oriented to person, place, and time. Normal reflexes, muscle tone coordination. No cranial nerve deficit noted. PSYCHIATRIC: Normal mood and affect. Normal behavior. Normal judgment and thought content. CARDIOVASCULAR: Normal heart rate noted, regular rhythm RESPIRATORY: Effort and breath sounds normal, no problems with respiration noted MUSCULOSKELETAL: Normal range of motion. No edema and no tenderness. 2+ distal pulses. ABDOMEN: Soft, nontender, nondistended, gravid. CERVIX:    Fetal monitoring: FHR: 175 by 02/10/2020 on admission 4/26  Results for orders placed or performed during the hospital encounter of 04/13/20 (from the past 48 hour(s))  Urinalysis, Routine w reflex  microscopic     Status: Abnormal   Collection Time: 04/13/20  8:00 AM  Result Value Ref Range   Color, Urine AMBER (A) YELLOW    Comment: BIOCHEMICALS MAY BE AFFECTED BY COLOR   APPearance HAZY (A) CLEAR   Specific Gravity, Urine 1.020 1.005 - 1.030   pH 5.0 5.0 - 8.0   Glucose, UA NEGATIVE NEGATIVE mg/dL   Hgb urine dipstick SMALL (A) NEGATIVE   Bilirubin Urine NEGATIVE NEGATIVE   Ketones, ur 80 (A) NEGATIVE mg/dL   Protein, ur 04/15/20 (A) NEGATIVE mg/dL   Nitrite NEGATIVE NEGATIVE   Leukocytes,Ua NEGATIVE NEGATIVE   RBC / HPF 6-10 0 - 5 RBC/hpf   WBC, UA 6-10 0 - 5 WBC/hpf   Bacteria, UA NONE SEEN NONE SEEN   Squamous Epithelial / LPF 6-10 0 - 5   Mucus PRESENT    Hyaline Casts, UA PRESENT     Comment: Performed at [facility], [address] [place], [place]  Urine rapid drug screen (hosp performed)     Status: Abnormal   Collection Time: 04/13/20  8:00 AM  Result Value Ref Range   Opiates NONE DETECTED NONE DETECTED   Cocaine NONE DETECTED NONE DETECTED   Benzodiazepines NONE DETECTED NONE DETECTED   Amphetamines NONE DETECTED NONE DETECTED   Tetrahydrocannabinol POSITIVE (A) NONE DETECTED   Barbiturates NONE DETECTED NONE DETECTED    Comment: (NOTE) DRUG SCREEN FOR MEDICAL PURPOSES ONLY.  IF CONFIRMATION IS NEEDED FOR ANY PURPOSE, NOTIFY LAB WITHIN 5 DAYS. LOWEST DETECTABLE LIMITS FOR URINE DRUG SCREEN Drug Class                     Cutoff (  ng/mL) Amphetamine and metabolites    1000 Barbiturate and metabolites    200 Benzodiazepine                 200 Tricyclics and metabolites     300 Opiates and metabolites        300 Cocaine and metabolites        300 THC                            50 Performed at [facility], [address] [place], [place] [address]   Comprehensive metabolic panel     Status: Abnormal   Collection Time: 04/13/20  9:31 AM  Result Value Ref Range   Sodium 139 135 - 145 mmol/L   Potassium 3.3 (L) 3.5 - 5.1 mmol/L    Chloride 105 98 - 111 mmol/L   CO2 15 (L) 22 - 32 mmol/L   Glucose, Bld 124 (H) 70 - 99 mg/dL    Comment: Glucose reference range applies only to samples taken after fasting for at least 8 hours.   BUN 16 6 - 20 mg/dL   Creatinine, Ser 9.62 (H) 0.44 - 1.00 mg/dL   Calcium 22.9 8.9 - 79.8 mg/dL   Total Protein 7.9 6.5 - 8.1 g/dL   Albumin 4.7 3.5 - 5.0 g/dL   AST 64 (H) 15 - 41 U/L   ALT 99 (H) 0 - 44 U/L   Alkaline Phosphatase 71 38 - 126 U/L   Total Bilirubin 2.1 (H) 0.3 - 1.2 mg/dL   GFR calc non Af Amer >60 >60 mL/min   GFR calc Af Amer >60 >60 mL/min   Anion gap 19 (H) 5 - 15    Comment: Performed at [facility], [address] [place], [place] [address]  CBC     Status: Abnormal   Collection Time: 04/13/20  9:31 AM  Result Value Ref Range   WBC 13.2 (H) 4.0 - 10.5 K/uL   RBC 4.68 3.87 - 5.11 MIL/uL   Hemoglobin 15.5 (H) 12.0 - 15.0 g/dL   HCT 41.7 (H) 40.8 - 14.4 %   MCV 99.4 80.0 - 100.0 fL   MCH 33.1 26.0 - 34.0 pg   MCHC 33.3 30.0 - 36.0 g/dL   RDW 81.8 56.3 - 14.9 %   Platelets 313 150 - 400 K/uL   nRBC 0.0 0.0 - 0.2 %    Comment: Performed at [facility], [address], [place], [place] [address]  Type and screen [facility]     Status: None   Collection Time: 04/13/20  9:31 AM  Result Value Ref Range   ABO/RH(D) O POS    Antibody Screen NEG    Sample Expiration      04/16/2020,2359 Performed at [facility], [address], [place], [place] [address]   TSH     Status: None   Collection Time: 04/13/20  9:31 AM  Result Value Ref Range   TSH 0.632 0.350 - 4.500 uIU/mL    Comment: Performed by a 3rd Generation assay with a functional sensitivity of <=0.01 uIU/mL. Performed at [facility], [address] [place], [place] [address]   ABO/Rh     Status: None   Collection Time: 04/13/20  9:31 AM  Result Value Ref Range   ABO/RH(D)      O POS Performed at [facility], [address] [place], [place]  [address]   Comprehensive metabolic panel     Status: Abnormal   Collection Time: 04/14/20  5:39 AM  Result Value Ref Range   Sodium 142 135 - 145 mmol/L   Potassium 3.6 3.5 - 5.1 mmol/L   Chloride 114 (H) 98 - 111 mmol/L   CO2 16 (L) 22 - 32 mmol/L   Glucose, Bld 77 70 - 99 mg/dL    Comment: Glucose reference range applies only to samples taken after fasting for at least 8 hours.   BUN 7 6 - 20 mg/dL   Creatinine, Ser 0.64 0.44 - 1.00 mg/dL   Calcium 8.8 (L) 8.9 - 10.3 mg/dL   Total Protein 5.9 (L) 6.5 - 8.1 g/dL   Albumin 3.4 (L) 3.5 - 5.0 g/dL   AST 49 (H) 15 - 41 U/L   ALT 89 (H) 0 - 44 U/L   Alkaline Phosphatase 53 38 - 126 U/L   Total Bilirubin 1.7 (H) 0.3 - 1.2 mg/dL   GFR calc non Af Amer >60 >60 mL/min   GFR calc Af Amer >60 >60 mL/min   Anion gap 12 5 - 15    Comment: Performed at [facility] [address], [place], [place] [address]  Magnesium     Status: None   Collection Time: 04/14/20  5:39 AM  Result Value Ref Range   Magnesium 2.1 1.7 - 2.4 mg/dL    Comment: Performed at [facility] [address] [place], [place] [address]  Phosphorus     Status: None   Collection Time: 04/14/20  5:39 AM  Result Value Ref Range   Phosphorus 2.5 2.5 - 4.6 mg/dL    Comment: Performed at [facility] [address] [place], [place] [address]    [place] Comp Less 14 Wks  Result Date: 04/13/2020 CLINICAL DATA:  Nausea and vomiting EXAM: OBSTETRIC <14 WK ULTRASOUND TECHNIQUE: Transabdominal ultrasound was performed for evaluation of the gestation as well as the maternal uterus and adnexal regions. COMPARISON:  None. FINDINGS: Intrauterine gestational sac: Visualized Yolk sac:  Visualized Embryo:  Visualized Cardiac Activity: Visualized Heart Rate: 175 bpm CRL:   26 mm   9 w 2 d                  [place] EDC: November 14, 2020 Subchorionic hemorrhage:  None visualized. Maternal uterus/adnexae: Cervical os is closed. Right ovary measures 3.0 x 2.1 x 2.1 cm. Left ovary  measures 2.5 x 3.3 x 2.5 cm. No extrauterine pelvic mass. No free pelvic fluid. IMPRESSION: Single live intrauterine gestation with estimated gestational age of 22+ weeks. No subchorionic hemorrhage evident. Study otherwise unremarkable. Electronically Signed   By: Lowella Grip III M.D.   On: 04/13/2020 10:53    Current scheduled medications . docusate sodium  100 mg Oral Daily  . glycopyrrolate  0.3 mg Intravenous TID  . methylPREDNISolone  16 mg Oral Q breakfast   Followed by  . [START ON 04/18/2020] methylPREDNISolone  8 mg Oral Q breakfast   Followed by  . [START ON 04/25/2020] methylPREDNISolone  4 mg Oral Q breakfast  . methylPREDNISolone  16 mg Oral Q1400   Followed by  . [START ON 04/16/2020] methylPREDNISolone  8 mg Oral Q1400   Followed by  . [START ON 04/19/2020] methylPREDNISolone  4 mg Oral Q1400  . methylPREDNISolone  16 mg Oral QHS   Followed by  . [START ON 04/17/2020] methylPREDNISolone  8 mg Oral QHS   Followed by  . [START ON 04/20/2020] methylPREDNISolone  4 mg Oral QHS  . prenatal multivitamin  1 tablet Oral Q1200  . promethazine  25 mg Intravenous Q6H  . scopolamine  1 patch Transdermal Q72H    I have reviewed the patient's current medications.  ASSESSMENT: Active Problems:   Hyperemesis   PLAN: Hyperemesis - currently on scheduled zofran, phenergan, robinul, scopolamine patch, and medrol taper which started yesterday.  Creatinine and liver enzymes improved from admission however patient still having emesis.  Will discuss increasing robinul with pharmacy if acceptable and continue current treatment course to allow time for steroids to work.  Consider dobhoff tube if no improvement noted over time.  Continue inpatient management at this time due to inability to tolerate PO.  Continue fluids.  Potassium and magnesium reviewed and acceptable.   Continue routine antenatal care.   Catalina Pizza, MD Obstetrician & Gynecologist, [facility] for [facility], [facility]

## 2020-04-15 LAB — COMPREHENSIVE METABOLIC PANEL
ALT: 79 U/L — ABNORMAL HIGH (ref 0–44)
AST: 46 U/L — ABNORMAL HIGH (ref 15–41)
Albumin: 3 g/dL — ABNORMAL LOW (ref 3.5–5.0)
Alkaline Phosphatase: 46 U/L (ref 38–126)
Anion gap: 8 (ref 5–15)
BUN: <5 mg/dL — ABNORMAL LOW (ref 6–20)
CO2: 18 mmol/L — ABNORMAL LOW (ref 22–32)
Calcium: 8.5 mg/dL — ABNORMAL LOW (ref 8.9–10.3)
Chloride: 116 mmol/L — ABNORMAL HIGH (ref 98–111)
Creatinine, Ser: 0.63 mg/dL (ref 0.44–1.00)
GFR calc Af Amer: >60 mL/min (ref >60)
GFR calc non Af Amer: >60 mL/min (ref >60)
Glucose, Bld: 87 mg/dL (ref 70–99)
Potassium: 3.3 mmol/L — ABNORMAL LOW (ref 3.5–5.1)
Sodium: 142 mmol/L (ref 135–145)
Total Bilirubin: 1.8 mg/dL — ABNORMAL HIGH (ref 0.3–1.2)
Total Protein: 5.3 g/dL — ABNORMAL LOW (ref 6.5–8.1)

## 2020-04-15 LAB — CBC WITH DIFFERENTIAL/PLATELET
Abs Immature Granulocytes: 0.07 10*3/uL (ref 0.00–0.07)
Basophils Absolute: 0 10*3/uL (ref 0.0–0.1)
Basophils Relative: 0 %
Eosinophils Absolute: 0 10*3/uL (ref 0.0–0.5)
Eosinophils Relative: 0 %
HCT: 31.8 % — ABNORMAL LOW (ref 36.0–46.0)
Hemoglobin: 10.7 g/dL — ABNORMAL LOW (ref 12.0–15.0)
Immature Granulocytes: 1 %
Lymphocytes Relative: 17 %
Lymphs Abs: 2.6 10*3/uL (ref 0.7–4.0)
MCH: 33.4 pg (ref 26.0–34.0)
MCHC: 33.6 g/dL (ref 30.0–36.0)
MCV: 99.4 fL (ref 80.0–100.0)
Monocytes Absolute: 1.1 10*3/uL — ABNORMAL HIGH (ref 0.1–1.0)
Monocytes Relative: 7 %
Neutro Abs: 11.4 10*3/uL — ABNORMAL HIGH (ref 1.7–7.7)
Neutrophils Relative %: 75 %
Platelets: 208 10*3/uL (ref 150–400)
RBC: 3.2 MIL/uL — ABNORMAL LOW (ref 3.87–5.11)
RDW: 12.1 % (ref 11.5–15.5)
WBC: 15.2 10*3/uL — ABNORMAL HIGH (ref 4.0–10.5)
nRBC: 0 % (ref 0.0–0.2)

## 2020-04-15 LAB — MAGNESIUM: Magnesium: 2 mg/dL (ref 1.7–2.4)

## 2020-04-15 LAB — PHOSPHORUS: Phosphorus: 2.2 mg/dL — ABNORMAL LOW (ref 2.5–4.6)

## 2020-04-15 LAB — GLUCOSE, CAPILLARY: Glucose-Capillary: 73 mg/dL (ref 70–99)

## 2020-04-15 MED ORDER — PROCHLORPERAZINE EDISYLATE 10 MG/2ML IJ SOLN
10.0000 mg | Freq: Four times a day (QID) | INTRAMUSCULAR | Status: DC
  Administered 2020-04-15 – 2020-04-16 (×4): 10 mg via INTRAVENOUS
  Filled 2020-04-15 (×5): qty 2

## 2020-04-15 MED ORDER — METHYLPREDNISOLONE SODIUM SUCC 40 MG IJ SOLR: 8.0000 mg | Freq: Every day | INTRAMUSCULAR | Status: DC

## 2020-04-15 MED ORDER — POTASSIUM CHLORIDE 10 MEQ/100ML IV SOLN
10.0000 meq | INTRAVENOUS | Status: AC
  Administered 2020-04-15 (×3): 10 meq via INTRAVENOUS
  Filled 2020-04-15 (×3): qty 100

## 2020-04-15 MED ORDER — ONDANSETRON HCL 4 MG/2ML IJ SOLN
4.0000 mg | Freq: Four times a day (QID) | INTRAMUSCULAR | Status: DC
  Administered 2020-04-15 – 2020-04-16 (×4): 4 mg via INTRAVENOUS
  Filled 2020-04-15 (×4): qty 2

## 2020-04-15 NOTE — Progress Notes (Signed)
IV site occluded, RN attempted once and charge nurse assessed , no other attempt. IV team consulted. RN waiting on reinsertion to be able to admin scheduled IV meds.

## 2020-04-15 NOTE — Progress Notes (Signed)
FACULTY PRACTICE ANTEPARTUM COMPREHENSIVE PROGRESS NOTE  Kristen Phillips is a 34 y.o. [id number] at [redacted]w[redacted]d who is admitted for hyperemesis.  Estimated Date of Delivery: 11/14/20 Fetal presentation is unsure.  Length of Stay:  2 Days. Admitted 04/13/2020  Subjective: Patient reports weakness and fatigue.  She vomited yesterday and today after trying soup or liquids.  She reports no abdominal pain, no bleeding and no loss of fluid per vagina.   Vitals:  Blood pressure 123/68, pulse 83, temperature 98.9 F (37.2 C), temperature source Oral, resp. rate 18, height 5\' 2"  (1.575 m), weight 59 kg, last menstrual period 02/08/2020, SpO2 99 %, unknown if currently breastfeeding. Physical Examination: CONSTITUTIONAL: Well-developed, well-nourished female in no acute distress.  HENT:  Normocephalic, atraumatic, External right and left ear normal. Oropharynx is clear and moist EYES: Conjunctivae and EOM are normal. Pupils are equal, round, and reactive to light. No scleral icterus.  NECK: Normal range of motion, supple, no masses SKIN: Skin is warm and dry. No rash noted. Not diaphoretic. No erythema. No pallor. NEUROLGIC: Alert and oriented to person, place, and time. Normal reflexes, muscle tone coordination. No cranial nerve deficit noted. PSYCHIATRIC: Normal mood and affect. Normal behavior. Normal judgment and thought content. CARDIOVASCULAR: Normal heart rate noted, regular rhythm RESPIRATORY: Effort and breath sounds normal, no problems with respiration noted MUSCULOSKELETAL: Normal range of motion. No edema and no tenderness. 2+ distal pulses. ABDOMEN: Soft, nontender, nondistended, gravid. CERVIX:  Deferred  Fetal monitoring: FHR: 175 by 02/10/2020 on admission 4/26  Labs and Imaging: CMP Latest Ref Rng & Units 04/15/2020 04/14/2020 04/13/2020  Glucose 70 - 99 mg/dL 87 77 04/15/2020)  BUN 6 - 20 mg/dL 245(Y) 7 16  Creatinine 0.44 - 1.00 mg/dL <0(D 9.83 3.82)  Sodium 135 - 145 mmol/L 142 142 139   Potassium 3.5 - 5.1 mmol/L 3.3(L) 3.6 3.3(L)  Chloride 98 - 111 mmol/L 116(H) 114(H) 105  CO2 22 - 32 mmol/L 18(L) 16(L) 15(L)  Calcium 8.9 - 10.3 mg/dL 5.05(L) 9.7(Q) 7.3(A  Total Protein 6.5 - 8.1 g/dL 5.3(L) 5.9(L) 7.9  Total Bilirubin 0.3 - 1.2 mg/dL 19.3) 7.9(K) 2.1(H)  Alkaline Phos 38 - 126 U/L 46 53 71  AST 15 - 41 U/L 46(H) 49(H) 64(H)  ALT 0 - 44 U/L 79(H) 89(H) 99(H)   CBC Latest Ref Rng & Units 04/15/2020 04/14/2020 04/13/2020  WBC 4.0 - 10.5 K/uL 15.2(H) 16.7(H) 13.2(H)  Hemoglobin 12.0 - 15.0 g/dL 10.7(L) 12.2 15.5(H)  Hematocrit 36.0 - 46.0 % 31.8(L) 36.1 46.5(H)  Platelets 150 - 400 K/uL 208 252 313    04/15/2020 OB Comp Less 14 Wks  Result Date: 04/13/2020 CLINICAL DATA:  Nausea and vomiting EXAM: OBSTETRIC <14 WK ULTRASOUND TECHNIQUE: Transabdominal ultrasound was performed for evaluation of the gestation as well as the maternal uterus and adnexal regions. COMPARISON:  None. FINDINGS: Intrauterine gestational sac: Visualized Yolk sac:  Visualized Embryo:  Visualized Cardiac Activity: Visualized Heart Rate: 175 bpm CRL:   26 mm   9 w 2 d                  04/15/2020 EDC: November 14, 2020 Subchorionic hemorrhage:  None visualized. Maternal uterus/adnexae: Cervical os is closed. Right ovary measures 3.0 x 2.1 x 2.1 cm. Left ovary measures 2.5 x 3.3 x 2.5 cm. No extrauterine pelvic mass. No free pelvic fluid. IMPRESSION: Single live intrauterine gestation with estimated gestational age of 9+ weeks. No subchorionic hemorrhage evident. Study otherwise unremarkable. Electronically Signed   By:  Lowella Grip III M.D.   On: 04/13/2020 10:53    Current scheduled medications . docusate sodium  100 mg Oral Daily  . glycopyrrolate  0.3 mg Intravenous TID  . methylPREDNISolone  16 mg Oral Q breakfast   Followed by  . [START ON 04/18/2020] methylPREDNISolone  8 mg Oral Q breakfast   Followed by  . [START ON 04/25/2020] methylPREDNISolone  4 mg Oral Q breakfast  . methylPREDNISolone  16 mg Oral Q1400    Followed by  . [START ON 04/16/2020] methylPREDNISolone  8 mg Oral Q1400   Followed by  . [START ON 04/19/2020] methylPREDNISolone  4 mg Oral Q1400  . methylPREDNISolone  16 mg Oral QHS   Followed by  . [START ON 04/17/2020] methylPREDNISolone  8 mg Oral QHS   Followed by  . [START ON 04/20/2020] methylPREDNISolone  4 mg Oral QHS  . methylPREDNISolone (SOLU-MEDROL) injection  16 mg Intravenous Q breakfast  . methylPREDNISolone (SOLU-MEDROL) injection  16 mg Intravenous Daily  . methylPREDNISolone (SOLU-MEDROL) injection  16 mg Intravenous QHS  . [START ON 04/16/2020] methylPREDNISolone (SOLU-MEDROL) injection  8 mg Intravenous Daily  . [START ON 04/17/2020] methylPREDNISolone (SOLU-MEDROL) injection  8 mg Intravenous QHS  . ondansetron (ZOFRAN) IV  4 mg Intravenous Q6H  . pantoprazole (PROTONIX) IV  40 mg Intravenous Q12H  . prenatal multivitamin  1 tablet Oral Q1200  . prochlorperazine  10 mg Intravenous Q6H  . promethazine  25 mg Intravenous Q6H  . scopolamine  1 patch Transdermal Q72H    I have reviewed the patient's current medications.  ASSESSMENT: Principal Problem:   Hyperemesis complicating pregnancy, antepartum   PLAN: Discussed placing feeding tube today, patient is adamantly against this plan She wants to try applesauce today and try oral challenge for one more day Will continue scheduled IV Zofran, Phenergan, Robinul, Scopolamine patch, and Medrol taper.  Also added IV Compazine today. Continue IV fluids.  Potassium repletion ordered, lab imbalances are resolving. Consider feeding tube if no improvement tomorrow.   Continue inpatient management at this time due to inability to tolerate any oral intake. Continue routine antenatal care.   Verita Schneiders, MD [place], [facility] for [employer], [place]

## 2020-04-15 NOTE — Plan of Care (Signed)
  Problem: Bowel/Gastric: Goal: Occurences of nausea and/or vomiting will decrease Outcome: Progressing   Problem: Fluid Volume: Goal: Maintenance of adequate hydration will improve Outcome: Progressing   Patient denies emesis this morning.

## 2020-04-15 NOTE — Progress Notes (Signed)
Responded to PIV consult. On first attempt, obtained flashback. Pt requested catheter be removed before catheter could be fully threaded due to "hurts". On second attempt, pt requested needle be removed during attempt. 2nd assessment requested from VAST member.

## 2020-04-16 LAB — COMPREHENSIVE METABOLIC PANEL
ALT: 113 U/L — ABNORMAL HIGH (ref 0–44)
AST: 72 U/L — ABNORMAL HIGH (ref 15–41)
Albumin: 2.8 g/dL — ABNORMAL LOW (ref 3.5–5.0)
Alkaline Phosphatase: 42 U/L (ref 38–126)
Anion gap: 10 (ref 5–15)
BUN: <5 mg/dL — ABNORMAL LOW (ref 6–20)
CO2: 16 mmol/L — ABNORMAL LOW (ref 22–32)
Calcium: 8.2 mg/dL — ABNORMAL LOW (ref 8.9–10.3)
Chloride: 113 mmol/L — ABNORMAL HIGH (ref 98–111)
Creatinine, Ser: 0.62 mg/dL (ref 0.44–1.00)
GFR calc Af Amer: >60 mL/min (ref >60)
GFR calc non Af Amer: >60 mL/min (ref >60)
Glucose, Bld: 79 mg/dL (ref 70–99)
Potassium: 3 mmol/L — ABNORMAL LOW (ref 3.5–5.1)
Sodium: 139 mmol/L (ref 135–145)
Total Bilirubin: 1.6 mg/dL — ABNORMAL HIGH (ref 0.3–1.2)
Total Protein: 4.9 g/dL — ABNORMAL LOW (ref 6.5–8.1)

## 2020-04-16 LAB — CBC WITH DIFFERENTIAL/PLATELET
Abs Immature Granulocytes: 0.05 10*3/uL (ref 0.00–0.07)
Basophils Absolute: 0 10*3/uL (ref 0.0–0.1)
Basophils Relative: 0 %
Eosinophils Absolute: 0 10*3/uL (ref 0.0–0.5)
Eosinophils Relative: 0 %
HCT: 29.4 % — ABNORMAL LOW (ref 36.0–46.0)
Hemoglobin: 10 g/dL — ABNORMAL LOW (ref 12.0–15.0)
Immature Granulocytes: 1 %
Lymphocytes Relative: 14 %
Lymphs Abs: 1.4 10*3/uL (ref 0.7–4.0)
MCH: 33.9 pg (ref 26.0–34.0)
MCHC: 34 g/dL (ref 30.0–36.0)
MCV: 99.7 fL (ref 80.0–100.0)
Monocytes Absolute: 0.6 10*3/uL (ref 0.1–1.0)
Monocytes Relative: 6 %
Neutro Abs: 8.1 10*3/uL — ABNORMAL HIGH (ref 1.7–7.7)
Neutrophils Relative %: 79 %
Platelets: 213 10*3/uL (ref 150–400)
RBC: 2.95 MIL/uL — ABNORMAL LOW (ref 3.87–5.11)
RDW: 12.1 % (ref 11.5–15.5)
WBC: 10.1 10*3/uL (ref 4.0–10.5)
nRBC: 0 % (ref 0.0–0.2)

## 2020-04-16 LAB — GLUCOSE, CAPILLARY: Glucose-Capillary: 82 mg/dL (ref 70–99)

## 2020-04-16 LAB — MAGNESIUM: Magnesium: 1.8 mg/dL (ref 1.7–2.4)

## 2020-04-16 MED ORDER — PROCHLORPERAZINE MALEATE 10 MG PO TABS
10.0000 mg | ORAL_TABLET | Freq: Four times a day (QID) | ORAL | Status: DC
  Administered 2020-04-16 (×2): 10 mg via ORAL
  Filled 2020-04-16 (×4): qty 1

## 2020-04-16 MED ORDER — GLYCOPYRROLATE 1 MG PO TABS: 2.0000 mg | ORAL_TABLET | Freq: Three times a day (TID) | ORAL | 3 refills | Status: DC | PRN

## 2020-04-16 MED ORDER — METHYLPREDNISOLONE 16 MG PO TABS
16.0000 mg | ORAL_TABLET | Freq: Every day | ORAL | Status: DC
  Filled 2020-04-16: qty 1

## 2020-04-16 MED ORDER — ONDANSETRON 4 MG PO TBDP: 4.0000 mg | ORAL_TABLET | Freq: Four times a day (QID) | ORAL | 2 refills | Status: DC | PRN

## 2020-04-16 MED ORDER — ONDANSETRON HCL 4 MG PO TABS
4.0000 mg | ORAL_TABLET | Freq: Four times a day (QID) | ORAL | Status: DC
  Administered 2020-04-16: 4 mg via ORAL
  Filled 2020-04-16: qty 1

## 2020-04-16 MED ORDER — METHYLPREDNISOLONE 4 MG PO TABS: 4.0000 mg | ORAL_TABLET | Freq: Every day | ORAL | Status: DC

## 2020-04-16 MED ORDER — METHYLPREDNISOLONE 4 MG PO TABS: 8.0000 mg | ORAL_TABLET | Freq: Every day | ORAL | Status: DC

## 2020-04-16 MED ORDER — POTASSIUM CHLORIDE CRYS ER 20 MEQ PO TBCR
20.0000 meq | EXTENDED_RELEASE_TABLET | Freq: Two times a day (BID) | ORAL | Status: DC
  Administered 2020-04-16: 20 meq via ORAL
  Filled 2020-04-16: qty 1

## 2020-04-16 MED ORDER — POTASSIUM CHLORIDE CRYS ER 20 MEQ PO TBCR: 40.0000 meq | EXTENDED_RELEASE_TABLET | Freq: Every day | ORAL | 1 refills | Status: DC

## 2020-04-16 MED ORDER — METHYLPREDNISOLONE 4 MG PO TABS
8.0000 mg | ORAL_TABLET | Freq: Every day | ORAL | Status: DC
  Administered 2020-04-16: 8 mg via ORAL
  Filled 2020-04-16: qty 2

## 2020-04-16 MED ORDER — METHYLPREDNISOLONE 8 MG PO TABS: ORAL_TABLET | ORAL | 2 refills | Status: DC

## 2020-04-16 MED ORDER — PROMETHAZINE HCL 25 MG PO TABS: 25.0000 mg | ORAL_TABLET | Freq: Four times a day (QID) | ORAL | Status: DC | PRN

## 2020-04-16 MED ORDER — PROMETHAZINE HCL 25 MG PO TABS: 25.0000 mg | ORAL_TABLET | Freq: Four times a day (QID) | ORAL | 2 refills | Status: DC | PRN

## 2020-04-16 MED ORDER — GLYCOPYRROLATE 1 MG PO TABS
2.0000 mg | ORAL_TABLET | Freq: Three times a day (TID) | ORAL | Status: DC
  Administered 2020-04-16 (×2): 2 mg via ORAL
  Filled 2020-04-16 (×2): qty 2

## 2020-04-16 MED ORDER — PROCHLORPERAZINE MALEATE 10 MG PO TABS: 10.0000 mg | ORAL_TABLET | Freq: Four times a day (QID) | ORAL | 2 refills | Status: DC | PRN

## 2020-04-16 MED ORDER — SCOPOLAMINE 1 MG/3DAYS TD PT72: 1.0000 | MEDICATED_PATCH | TRANSDERMAL | 12 refills | Status: DC

## 2020-04-16 NOTE — Progress Notes (Signed)
FACULTY PRACTICE ANTEPARTUM COMPREHENSIVE PROGRESS NOTE  Kristen Phillips is a 34 y.o. [id number] at [redacted]w[redacted]d who is admitted for hyperemesis.  Estimated Date of Delivery: 11/14/20 Fetal presentation is unsure.  Length of Stay:  3 Days. Admitted 04/13/2020  Subjective: Patient reports tolerating a little food since yesterday. Wants to go home today. Has eaten some [place] toast and keeping in down so far.  She reports no abdominal pain, no bleeding and no loss of fluid per vagina.   Vitals:  Blood pressure 127/77, pulse 85, temperature 98.7 F (37.1 C), temperature source Oral, resp. rate 17, height 5\' 2"  (1.575 m), weight 59 kg, last menstrual period 02/08/2020, SpO2 100 %, unknown if currently breastfeeding. Physical Examination: CONSTITUTIONAL: Well-developed, well-nourished female in no acute distress.  HENT:  Normocephalic, atraumatic, External right and left ear normal. Oropharynx is clear and moist EYES: Conjunctivae and EOM are normal. Pupils are equal, round, and reactive to light. No scleral icterus.  NECK: Normal range of motion, supple, no masses SKIN: Skin is warm and dry. No rash noted. Not diaphoretic. No erythema. No pallor. NEUROLGIC: Alert and oriented to person, place, and time. Normal reflexes, muscle tone coordination. No cranial nerve deficit noted. PSYCHIATRIC: Normal mood and affect. Normal behavior. Normal judgment and thought content. CARDIOVASCULAR: Normal heart rate noted, regular rhythm RESPIRATORY: Effort and breath sounds normal, no problems with respiration noted MUSCULOSKELETAL: Normal range of motion. No edema and no tenderness. 2+ distal pulses. ABDOMEN: Soft, nontender, nondistended, gravid. CERVIX:  Deferred  Fetal monitoring: FHR: 175 by 02/10/2020 on admission 4/26  Labs and Imaging: CMP Latest Ref Rng & Units 04/16/2020 04/15/2020 04/14/2020  Glucose 70 - 99 mg/dL 79 87 77  BUN 6 - 20 mg/dL 04/16/2020) <5(G) 7  Creatinine 0.44 - 1.00 mg/dL <3(O 7.56 4.33  Sodium 135  - 145 mmol/L 139 142 142  Potassium 3.5 - 5.1 mmol/L 3.0(L) 3.3(L) 3.6  Chloride 98 - 111 mmol/L 113(H) 116(H) 114(H)  CO2 22 - 32 mmol/L 16(L) 18(L) 16(L)  Calcium 8.9 - 10.3 mg/dL 8.2(L) 8.5(L) 8.8(L)  Total Protein 6.5 - 8.1 g/dL 4.9(L) 5.3(L) 5.9(L)  Total Bilirubin 0.3 - 1.2 mg/dL 2.95) 1.8(A) 4.1(Y)  Alkaline Phos 38 - 126 U/L 42 46 53  AST 15 - 41 U/L 72(H) 46(H) 49(H)  ALT 0 - 44 U/L 113(H) 79(H) 89(H)   CBC Latest Ref Rng & Units 04/16/2020 04/15/2020 04/14/2020  WBC 4.0 - 10.5 K/uL 10.1 15.2(H) 16.7(H)  Hemoglobin 12.0 - 15.0 g/dL 10.0(L) 10.7(L) 12.2  Hematocrit 36.0 - 46.0 % 29.4(L) 31.8(L) 36.1  Platelets 150 - 400 K/uL 213 208 252    No results found.  Current scheduled medications . docusate sodium  100 mg Oral Daily  . glycopyrrolate  2 mg Oral TID  . [START ON 04/17/2020] methylPREDNISolone  16 mg Oral Q breakfast   Followed by  . [START ON 04/18/2020] methylPREDNISolone  8 mg Oral Q breakfast   Followed by  . [START ON 04/25/2020] methylPREDNISolone  4 mg Oral Q breakfast  . methylPREDNISolone  16 mg Oral QHS   Followed by  . [START ON 04/17/2020] methylPREDNISolone  8 mg Oral QHS   Followed by  . [START ON 04/20/2020] methylPREDNISolone  4 mg Oral QHS  . methylPREDNISolone  8 mg Oral Daily   Followed by  . [START ON 04/19/2020] methylPREDNISolone  4 mg Oral Daily  . ondansetron  4 mg Oral Q6H  . pantoprazole (PROTONIX) IV  40 mg Intravenous Q12H  .  prenatal multivitamin  1 tablet Oral Q1200  . prochlorperazine  10 mg Oral Q6H  . scopolamine  1 patch Transdermal Q72H    I have reviewed the patient's current medications.  ASSESSMENT: Principal Problem:   Hyperemesis complicating pregnancy, antepartum   PLAN: Antiemetics switched to oral formulations today Continue regular diet as tolerated Oral KCL repletion ordered If still tolerating medications and food, will consider discharge later but told patient it may be prudent to remain in house for one more  day Continue routine antenatal care.   Verita Schneiders, MD [place], [facility] for [employer], [place]

## 2020-04-16 NOTE — Discharge Instructions (Signed)
Hyperemesis Gravidarum Hyperemesis gravidarum is a severe form of nausea and vomiting that happens during pregnancy. Hyperemesis is worse than morning sickness. It may cause you to have nausea or vomiting all day for many days. It may keep you from eating and drinking enough food and liquids, which can lead to dehydration, malnutrition, and weight loss. Hyperemesis usually occurs during the first half (the first 20 weeks) of pregnancy. It often goes away once a woman is in her second half of pregnancy. However, sometimes hyperemesis continues through an entire pregnancy. What are the causes? The cause of this condition is not known. It may be related to changes in chemicals (hormones) in the body during pregnancy, such as the high level of pregnancy hormone (human chorionic gonadotropin) or the increase in the female sex hormone (estrogen). What are the signs or symptoms? Symptoms of this condition include:  Nausea that does not go away.  Vomiting that does not allow you to keep any food down.  Weight loss.  Body fluid loss (dehydration).  Having no desire to eat, or not liking food that you have previously enjoyed. How is this diagnosed? This condition may be diagnosed based on:  A physical exam.  Your medical history.  Your symptoms.  Blood tests.  Urine tests. How is this treated? This condition is managed by controlling symptoms. This may include:  Following an eating plan. This can help lessen nausea and vomiting.  Taking prescription medicines. An eating plan and medicines are often used together to help control symptoms. If medicines do not help relieve nausea and vomiting, you may need to receive fluids through an IV at the hospital. Follow these instructions at home: Eating and drinking   Avoid the following: ? Drinking fluids with meals. Try not to drink anything during the 30 minutes before and after your meals. ? Drinking more than 1 cup of fluid at a  time. ? Eating foods that trigger your symptoms. These may include spicy foods, coffee, high-fat foods, very sweet foods, and acidic foods. ? Skipping meals. Nausea can be more intense on an empty stomach. If you cannot tolerate food, do not force it. Try sucking on ice chips or other frozen items and make up for missed calories later. ? Lying down within 2 hours after eating. ? Being exposed to environmental triggers. These may include food smells, smoky rooms, closed spaces, rooms with strong smells, warm or humid places, overly loud and noisy rooms, and rooms with motion or flickering lights. Try eating meals in a well-ventilated area that is free of strong smells. ? Quick and sudden changes in your movement. ? Taking iron pills and multivitamins that contain iron. If you take prescription iron pills, do not stop taking them unless your health care provider approves. ? Preparing food. The smell of food can spoil your appetite or trigger nausea.  To help relieve your symptoms: ? Listen to your body. Everyone is different and has different preferences. Find what works best for you. ? Eat and drink slowly. ? Eat 5-6 small meals daily instead of 3 large meals. Eating small meals and snacks can help you avoid an empty stomach. ? In the morning, before getting out of bed, eat a couple of crackers to avoid moving around on an empty stomach. ? Try eating starchy foods as these are usually tolerated well. Examples include cereal, toast, bread, potatoes, pasta, rice, and pretzels. ? Include at least 1 serving of protein with your meals and snacks. Protein options include   lean meats, poultry, seafood, beans, nuts, nut butters, eggs, cheese, and yogurt. ? Try eating a protein-rich snack before bed. Examples of a protein-rick snack include cheese and crackers or a peanut butter sandwich made with 1 slice of whole-wheat bread and 1 tsp (5 g) of peanut butter. ? Eat or suck on things that have ginger in them.  It may help relieve nausea. Add  tsp ground ginger to hot tea or choose ginger tea. ? Try drinking 100% fruit juice or an electrolyte drink. An electrolyte drink contains sodium, potassium, and chloride. ? Drink fluids that are cold, clear, and carbonated or sour. Examples include lemonade, ginger ale, lemon-lime soda, ice water, and sparkling water. ? Brush your teeth or use a mouth rinse after meals. ? Talk with your health care provider about starting a supplement of vitamin B6. General instructions  Take over-the-counter and prescription medicines only as told by your health care provider.  Follow instructions from your health care provider about eating or drinking restrictions.  Continue to take your prenatal vitamins as told by your health care provider. If you are having trouble taking your prenatal vitamins, talk with your health care provider about different options.  Keep all follow-up and pre-birth (prenatal) visits as told by your health care provider. This is important. Contact a health care provider if:  You have pain in your abdomen.  You have a severe headache.  You have vision problems.  You are losing weight.  You feel weak or dizzy. Get help right away if:  You cannot drink fluids without vomiting.  You vomit blood.  You have constant nausea and vomiting.  You are very weak.  You faint.  You have a fever and your symptoms suddenly get worse. Summary  Hyperemesis gravidarum is a severe form of nausea and vomiting that happens during pregnancy.  Making some changes to your eating habits may help relieve nausea and vomiting.  This condition may be managed with medicine.  If medicines do not help relieve nausea and vomiting, you may need to receive fluids through an IV at the hospital. This information is not intended to replace advice given to you by your health care provider. Make sure you discuss any questions you have with your health care  provider. Document Revised: 12/25/2017 Document Reviewed: 08/03/2016 Elsevier Patient Education  2020 Elsevier Inc.    Cannabinoid Hyperemesis Syndrome Cannabinoid hyperemesis syndrome (CHS) is a condition that causes repeated nausea, vomiting, and abdominal pain after long-term (chronic) use of marijuana (cannabis). People with CHS typically use marijuana 3-5 times a day for many years before they have symptoms, although it is possible to develop CHS with as little as 1 use per day. Symptoms of CHS may be mild at first but can get worse and more frequent. In some cases, CHS may cause vomiting many times a day, which can lead to weight loss and dehydration. CHS may go away and come back many times (recur). People may not have symptoms or may otherwise be healthy in between [facility] attacks. What are the causes? The exact cause of this condition is not known. Long-term use of marijuana may over-stimulate certain proteins in the brain that react with chemicals in marijuana (cannabinoid receptors). This over-stimulation may cause CHS. What are the signs or symptoms? Symptoms of this condition are often mild during the first few attacks, but they can get worse over time. Symptoms may include:  Frequent nausea, especially early in the morning.  Vomiting.  Abdominal pain. Taking  several hot showers throughout the day can also be a sign of this condition. People with CHS may do this because it relieves symptoms. How is this diagnosed? This condition may be diagnosed based on:  Your symptoms and medical history, including any drug use.  A physical exam. You may have tests done to rule out other problems. These tests may include:  Blood tests.  Urine tests.  Imaging tests, such as an X-ray or CT scan. How is this treated? Treatment for this condition involves stopping marijuana use. Your health care provider may recommend:  A drug rehabilitation program, if you have trouble stopping marijuana  use.  Medicines for nausea.  Hot showers to help relieve symptoms. Certain creams that contain a substance called capsaicin may improve symptoms when applied to the abdomen. Ask your health care provider before starting any medicines or other treatments. Severe nausea and vomiting may require you to stay at the hospital. You may need IV fluids to prevent or treat dehydration. You may also need certain medicines that must be given at the hospital. Follow these instructions at home: During an attack   Stay in bed and rest in a dark, quiet room.  Take anti-nausea medicine as told by your health care provider.  Try taking hot showers to relieve your symptoms. After an attack  Drink small amounts of clear fluids slowly. Gradually add more.  Once you are able to eat without vomiting, eat soft foods in small amounts every 3-4 hours. General instructions   Do not use any products that contain marijuana.If you need help quitting, ask your health care provider for resources and treatment options.  Drink enough fluid to keep your urine pale yellow. Avoid drinking fluids that have a lot of sugar or caffeine, such as coffee and soda.  Take and apply over-the-counter and prescription medicines only as told by your health care provider. Ask your health care provider before starting any new medicines or treatments.  Keep all follow-up visits as told by your health care provider. This is important. Contact a health care provider if:  Your symptoms get worse.  You cannot drink fluids without vomiting.  You have pain and trouble swallowing after an attack. Get help right away if:  You cannot stop vomiting.  You have blood in your vomit or your vomit looks like coffee grounds.  You have severe abdominal pain.  You have stools that are bloody or black, or stools that look like tar.  You have symptoms of dehydration, such as: ? Sunken eyes. ? Inability to make tears. ? Cracked  lips. ? Dry mouth. ? Decreased urine production. ? Weakness. ? Sleepiness. ? Fainting. Summary  Cannabinoid hyperemesis syndrome (CHS) is a condition that causes repeated nausea, vomiting, and abdominal pain after long-term use of marijuana.  People with CHS typically use marijuana 3-5 times a day for many years before they have symptoms, although it is possible to develop CHS with as little as 1 use per day.  Treatment for this condition involves stopping marijuana use. Hot showers and capsaicin creams may also help relieve symptoms. Ask your health care provider before starting any medicines or other treatments.  Your health care provider may prescribe medicines to help with nausea.  Get help right away if you have signs of dehydration, such as dry mouth, decreased urine production, or weakness. This information is not intended to replace advice given to you by your health care provider. Make sure you discuss any questions you have with  your health care provider. Document Revised: 04/13/2018 Document Reviewed: 03/15/2017 Elsevier Patient Education  [place].

## 2020-04-16 NOTE — Discharge Summary (Signed)
Antenatal Physician Discharge Summary  Patient ID: Kristen Phillips MRN: [id number] DOB/AGE: 07/11/1986 34 y.o.  Admit date: 04/13/2020 Discharge date: 04/16/2020  Admission Diagnoses: Hyperemesis gravidarum at [redacted] weeks gestation  Discharge Diagnoses: The same  Prenatal Procedures: ultrasound  Hospital Course:  Kristen Phillips is a 34 y.o. [id number] with IUP at 82w5dadmitted for HEG; had 10# weight loss in one week and electrolyte/LFT imbalances. No vaginal bleeding or otheraute obstetric concerns. She was given aggressive IV hydration and treated with a myriad of antiemetics and methylprednisolone taper.  She started to feel better and was able to tolerate a little food and liquids, but not as much as desired. Of note, feeding tube was recommended, she refused this.  On day of discharge, she reported that her daughter was not feeling well so she desired to go home. She was told that she had minimal food intake and staying another day was recommended but she wanted to go home. Cautioned about increased risk of re-admission and needing feeding tube in subsequent admissions given lack of adequate nutrition. She was able to tolerate her medications orally so these were prescribed for her. [facility]    Discharge Exam: Temp:  [98.3 F (36.8 C)-99.3 F (37.4 C)] 99.3 F (37.4 C) (04/29 1119) Pulse Rate:  [80-92] 92 (04/29 1119) Resp:  [16-19] 16 (04/29 1119) BP: (114-127)/(68-77) 119/75 (04/29 1119) SpO2:  [100 %] 100 % (04/29 1119) Physical Examination: CONSTITUTIONAL: No acute distress.  HENT:  Normocephalic, atraumatic, External right and left ear normal. Oropharynx is clear and moist EYES: Conjunctivae and EOM are normal. Pupils are equal, round, and reactive to light. No scleral icterus.  NECK: Normal range of motion, supple, no masses SKIN: Skin is warm and dry. No rash noted. Not diaphoretic. No erythema. No  pallor. [place] and oriented to person, place, and time. Normal reflexes, muscle tone coordination. No cranial nerve deficit noted. PSYCHIATRIC: Normal mood and affect. Normal behavior. Normal judgment and thought content. CARDIOVASCULAR: Normal heart rate noted, regular rhythm RESPIRATORY: Effort and breath sounds normal, no problems with respiration noted MUSCULOSKELETAL: Normal range of motion. No edema and no tenderness. 2+ distal pulses. ABDOMEN: Soft, nontender, nondistended, gravid. CERVIX:  Deferred  Significant Diagnostic Studies:  Results for orders placed or performed during the hospital encounter of 04/13/20 (from the past 168 hour(s))  Urinalysis, Routine w reflex microscopic   Collection Time: 04/13/20  8:00 AM  Result Value Ref Range   Color, Urine AMBER (A) YELLOW   APPearance HAZY (A) CLEAR   Specific Gravity, Urine 1.020 1.005 - 1.030   pH 5.0 5.0 - 8.0   Glucose, UA NEGATIVE NEGATIVE mg/dL   Hgb urine dipstick SMALL (A) NEGATIVE   Bilirubin Urine NEGATIVE NEGATIVE   Ketones, ur 80 (A) NEGATIVE mg/dL   Protein, ur 100 (A) NEGATIVE mg/dL   Nitrite NEGATIVE NEGATIVE   Leukocytes,Ua NEGATIVE NEGATIVE   RBC / HPF 6-10 0 - 5 RBC/hpf   WBC, UA 6-10 0 - 5 WBC/hpf   Bacteria, UA NONE SEEN NONE SEEN   Squamous Epithelial / LPF 6-10 0 - 5   Mucus PRESENT    Hyaline Casts, UA PRESENT   Urine rapid drug screen (hosp performed)   Collection Time: 04/13/20  8:00 AM  Result Value Ref Range   Opiates NONE DETECTED NONE DETECTED   Cocaine NONE DETECTED NONE DETECTED   Benzodiazepines NONE DETECTED NONE DETECTED   Amphetamines  NONE DETECTED NONE DETECTED   Tetrahydrocannabinol POSITIVE (A) NONE DETECTED   Barbiturates NONE DETECTED NONE DETECTED  Comprehensive metabolic panel   Collection Time: 04/13/20  9:31 AM  Result Value Ref Range   Sodium 139 135 - 145 mmol/L   Potassium 3.3 (L) 3.5 - 5.1 mmol/L   Chloride 105 98 - 111 mmol/L   CO2 15 (L) 22 - 32 mmol/L    Glucose, Bld 124 (H) 70 - 99 mg/dL   BUN 16 6 - 20 mg/dL   Creatinine, Ser 1.03 (H) 0.44 - 1.00 mg/dL   Calcium 10.1 8.9 - 10.3 mg/dL   Total Protein 7.9 6.5 - 8.1 g/dL   Albumin 4.7 3.5 - 5.0 g/dL   AST 64 (H) 15 - 41 U/L   ALT 99 (H) 0 - 44 U/L   Alkaline Phosphatase 71 38 - 126 U/L   Total Bilirubin 2.1 (H) 0.3 - 1.2 mg/dL   GFR calc non Af Amer >60 >60 mL/min   GFR calc Af Amer >60 >60 mL/min   Anion gap 19 (H) 5 - 15  CBC   Collection Time: 04/13/20  9:31 AM  Result Value Ref Range   WBC 13.2 (H) 4.0 - 10.5 K/uL   RBC 4.68 3.87 - 5.11 MIL/uL   Hemoglobin 15.5 (H) 12.0 - 15.0 g/dL   HCT 46.5 (H) 36.0 - 46.0 %   MCV 99.4 80.0 - 100.0 fL   MCH 33.1 26.0 - 34.0 pg   MCHC 33.3 30.0 - 36.0 g/dL   RDW 11.9 11.5 - 15.5 %   Platelets 313 150 - 400 K/uL   nRBC 0.0 0.0 - 0.2 %  TSH   Collection Time: 04/13/20  9:31 AM  Result Value Ref Range   TSH 0.632 0.350 - 4.500 uIU/mL  Type and screen [place]   Collection Time: 04/13/20  9:31 AM  Result Value Ref Range   ABO/RH(D) O POS    Antibody Screen NEG    Sample Expiration      04/16/2020,2359 Performed at [facility], [address], [place], [place] [address]   ABO/Rh   Collection Time: 04/13/20  9:31 AM  Result Value Ref Range   ABO/RH(D)      O POS Performed at [facility] [address] [place], [place] [address]   Comprehensive metabolic panel   Collection Time: 04/14/20  5:39 AM  Result Value Ref Range   Sodium 142 135 - 145 mmol/L   Potassium 3.6 3.5 - 5.1 mmol/L   Chloride 114 (H) 98 - 111 mmol/L   CO2 16 (L) 22 - 32 mmol/L   Glucose, Bld 77 70 - 99 mg/dL   BUN 7 6 - 20 mg/dL   Creatinine, Ser 0.64 0.44 - 1.00 mg/dL   Calcium 8.8 (L) 8.9 - 10.3 mg/dL   Total Protein 5.9 (L) 6.5 - 8.1 g/dL   Albumin 3.4 (L) 3.5 - 5.0 g/dL   AST 49 (H) 15 - 41 U/L   ALT 89 (H) 0 - 44 U/L   Alkaline Phosphatase 53 38 - 126 U/L   Total Bilirubin 1.7 (H) 0.3 - 1.2 mg/dL   GFR calc non Af  Amer >60 >60 mL/min   GFR calc Af Amer >60 >60 mL/min   Anion gap 12 5 - 15  Magnesium   Collection Time: 04/14/20  5:39 AM  Result Value Ref Range   Magnesium 2.1 1.7 - 2.4 mg/dL  Phosphorus  Collection Time: 04/14/20  5:39 AM  Result Value Ref Range   Phosphorus 2.5 2.5 - 4.6 mg/dL  CBC with Differential/Platelet   Collection Time: 04/14/20  5:39 AM  Result Value Ref Range   WBC 16.7 (H) 4.0 - 10.5 K/uL   RBC 3.60 (L) 3.87 - 5.11 MIL/uL   Hemoglobin 12.2 12.0 - 15.0 g/dL   HCT 36.1 36.0 - 46.0 %   MCV 100.3 (H) 80.0 - 100.0 fL   MCH 33.9 26.0 - 34.0 pg   MCHC 33.8 30.0 - 36.0 g/dL   RDW 12.0 11.5 - 15.5 %   Platelets 252 150 - 400 K/uL   nRBC 0.0 0.0 - 0.2 %   Neutrophils Relative % 76 %   Neutro Abs 12.8 (H) 1.7 - 7.7 K/uL   Lymphocytes Relative 15 %   Lymphs Abs 2.5 0.7 - 4.0 K/uL   Monocytes Relative 8 %   Monocytes Absolute 1.4 (H) 0.1 - 1.0 K/uL   Eosinophils Relative 0 %   Eosinophils Absolute 0.0 0.0 - 0.5 K/uL   Basophils Relative 0 %   Basophils Absolute 0.1 0.0 - 0.1 K/uL   Immature Granulocytes 1 %   Abs Immature Granulocytes 0.08 (H) 0.00 - 0.07 K/uL  Glucose, capillary   Collection Time: 04/14/20  9:25 AM  Result Value Ref Range   Glucose-Capillary 73 70 - 99 mg/dL  Glucose, capillary   Collection Time: 04/15/20  5:26 AM  Result Value Ref Range   Glucose-Capillary 73 70 - 99 mg/dL  Comprehensive metabolic panel   Collection Time: 04/15/20  7:32 AM  Result Value Ref Range   Sodium 142 135 - 145 mmol/L   Potassium 3.3 (L) 3.5 - 5.1 mmol/L   Chloride 116 (H) 98 - 111 mmol/L   CO2 18 (L) 22 - 32 mmol/L   Glucose, Bld 87 70 - 99 mg/dL   BUN <5 (L) 6 - 20 mg/dL   Creatinine, Ser 0.63 0.44 - 1.00 mg/dL   Calcium 8.5 (L) 8.9 - 10.3 mg/dL   Total Protein 5.3 (L) 6.5 - 8.1 g/dL   Albumin 3.0 (L) 3.5 - 5.0 g/dL   AST 46 (H) 15 - 41 U/L   ALT 79 (H) 0 - 44 U/L   Alkaline Phosphatase 46 38 - 126 U/L   Total Bilirubin 1.8 (H) 0.3 - 1.2 mg/dL   GFR calc  non Af Amer >60 >60 mL/min   GFR calc Af Amer >60 >60 mL/min   Anion gap 8 5 - 15  Magnesium   Collection Time: 04/15/20  7:32 AM  Result Value Ref Range   Magnesium 2.0 1.7 - 2.4 mg/dL  Phosphorus   Collection Time: 04/15/20  7:32 AM  Result Value Ref Range   Phosphorus 2.2 (L) 2.5 - 4.6 mg/dL  CBC with Differential/Platelet   Collection Time: 04/15/20  7:32 AM  Result Value Ref Range   WBC 15.2 (H) 4.0 - 10.5 K/uL   RBC 3.20 (L) 3.87 - 5.11 MIL/uL   Hemoglobin 10.7 (L) 12.0 - 15.0 g/dL   HCT 31.8 (L) 36.0 - 46.0 %   MCV 99.4 80.0 - 100.0 fL   MCH 33.4 26.0 - 34.0 pg   MCHC 33.6 30.0 - 36.0 g/dL   RDW 12.1 11.5 - 15.5 %   Platelets 208 150 - 400 K/uL   nRBC 0.0 0.0 - 0.2 %   Neutrophils Relative % 75 %   Neutro Abs 11.4 (H) 1.7 - 7.7 K/uL  Lymphocytes Relative 17 %   Lymphs Abs 2.6 0.7 - 4.0 K/uL   Monocytes Relative 7 %   Monocytes Absolute 1.1 (H) 0.1 - 1.0 K/uL   Eosinophils Relative 0 %   Eosinophils Absolute 0.0 0.0 - 0.5 K/uL   Basophils Relative 0 %   Basophils Absolute 0.0 0.0 - 0.1 K/uL   Immature Granulocytes 1 %   Abs Immature Granulocytes 0.07 0.00 - 0.07 K/uL  Glucose, capillary   Collection Time: 04/16/20  5:07 AM  Result Value Ref Range   Glucose-Capillary 82 70 - 99 mg/dL  Comprehensive metabolic panel   Collection Time: 04/16/20  6:03 AM  Result Value Ref Range   Sodium 139 135 - 145 mmol/L   Potassium 3.0 (L) 3.5 - 5.1 mmol/L   Chloride 113 (H) 98 - 111 mmol/L   CO2 16 (L) 22 - 32 mmol/L   Glucose, Bld 79 70 - 99 mg/dL   BUN <5 (L) 6 - 20 mg/dL   Creatinine, Ser 0.62 0.44 - 1.00 mg/dL   Calcium 8.2 (L) 8.9 - 10.3 mg/dL   Total Protein 4.9 (L) 6.5 - 8.1 g/dL   Albumin 2.8 (L) 3.5 - 5.0 g/dL   AST 72 (H) 15 - 41 U/L   ALT 113 (H) 0 - 44 U/L   Alkaline Phosphatase 42 38 - 126 U/L   Total Bilirubin 1.6 (H) 0.3 - 1.2 mg/dL   GFR calc non Af Amer >60 >60 mL/min   GFR calc Af Amer >60 >60 mL/min   Anion gap 10 5 - 15  Magnesium   Collection  Time: 04/16/20  6:03 AM  Result Value Ref Range   Magnesium 1.8 1.7 - 2.4 mg/dL  CBC with Differential/Platelet   Collection Time: 04/16/20  6:03 AM  Result Value Ref Range   WBC 10.1 4.0 - 10.5 K/uL   RBC 2.95 (L) 3.87 - 5.11 MIL/uL   Hemoglobin 10.0 (L) 12.0 - 15.0 g/dL   HCT 29.4 (L) 36.0 - 46.0 %   MCV 99.7 80.0 - 100.0 fL   MCH 33.9 26.0 - 34.0 pg   MCHC 34.0 30.0 - 36.0 g/dL   RDW 12.1 11.5 - 15.5 %   Platelets 213 150 - 400 K/uL   nRBC 0.0 0.0 - 0.2 %   Neutrophils Relative % 79 %   Neutro Abs 8.1 (H) 1.7 - 7.7 K/uL   Lymphocytes Relative 14 %   Lymphs Abs 1.4 0.7 - 4.0 K/uL   Monocytes Relative 6 %   Monocytes Absolute 0.6 0.1 - 1.0 K/uL   Eosinophils Relative 0 %   Eosinophils Absolute 0.0 0.0 - 0.5 K/uL   Basophils Relative 0 %   Basophils Absolute 0.0 0.0 - 0.1 K/uL   Immature Granulocytes 1 %   Abs Immature Granulocytes 0.05 0.00 - 0.07 K/uL   US OB Comp Less 14 Wks  Result Date: 04/13/2020 CLINICAL DATA:  Nausea and vomiting EXAM: OBSTETRIC <14 WK ULTRASOUND TECHNIQUE: Transabdominal ultrasound was performed for evaluation of the gestation as well as the maternal uterus and adnexal regions. COMPARISON:  None. FINDINGS: Intrauterine gestational sac: Visualized Yolk sac:  Visualized Embryo:  Visualized Cardiac Activity: Visualized Heart Rate: 175 bpm CRL:   26 mm   9 w 2 d                  [place] EDC: November 14, 2020 Subchorionic hemorrhage:  None visualized. Maternal uterus/adnexae: Cervical os is closed. Right ovary measures 3.0 x  2.1 x 2.1 cm. Left ovary measures 2.5 x 3.3 x 2.5 cm. No extrauterine pelvic mass. No free pelvic fluid. IMPRESSION: Single live intrauterine gestation with estimated gestational age of 35+ weeks. No subchorionic hemorrhage evident. Study otherwise unremarkable. Electronically Signed   By: Lowella Grip III M.D.   On: 04/13/2020 10:53    Future Appointments  Date Time Provider [place]  04/23/2020  1:30 PM [facility] INTAKE [facility]  [place]  05/01/2020  9:55 AM Danielle Rankin [facility]    Discharge Condition: Improved  Discharge disposition: 01-Home or Self Care        Allergies as of 04/16/2020   No Known Allergies     Medication List    TAKE these medications   glycopyrrolate 1 MG tablet Commonly known as: ROBINUL Take 2 tablets (2 mg total) by mouth 3 (three) times daily as needed (spitting).   methylPREDNISolone 8 MG tablet Commonly known as: MEDROL Take as directed by mouth: 4/29 16 mg  4/30 16 mg-8 mg-8 mg 5/1 16m-8mg-8mg  5/2 811m4 mg-8 mg 5/3 8 mg-4 mg-66m53m/4 8mg61mmg 5/5 8mg-69mg 5/5 8mg  109m 8mg 5/63mmg 5/855mg   on666msetron 4 MG disintegrating tablet Commonly known as: ZOFRAN-ODT Take 1 tablet (4 mg total) by mouth 4 (four) times daily as needed for nausea or vomiting. What changed:   when to take this  reasons to take this   potassium chloride SA 20 MEQ tablet Commonly known as: KLOR-CON Take 2 tablets (40 mEq total) by mouth daily.   prenatal multivitamin Tabs tablet Take 1 tablet by mouth daily at 12 noon.   prochlorperazine 10 MG tablet Commonly known as: COMPAZINE Take 1 tablet (10 mg total) by mouth every 6 (six) hours as needed for nausea or vomiting.   promethazine 25 MG tablet Commonly known as: PHENERGAN Take 1 tablet (25 mg total) by mouth 4 (four) times daily as needed for nausea or vomiting. What changed: when to take this   scopolamine 1 MG/3DAYS Commonly known as: TRANSDERM-SCOP Place 1 patch (1.5 mg total) onto the skin every 3 (three) days. Start taking on: Apr 19, 2020        Signed: Leonardo Plaia AnVerita Schneiders9/2021, 4:03 PM

## 2020-04-16 NOTE — Progress Notes (Signed)
Pt discharged to home with significant other.  Condition stable - as noted in MD Discharge Summary.  Pt ambulated to car with RN.  No equipment for home ordered at discharge.

## 2020-04-20 ENCOUNTER — Other Ambulatory Visit: Payer: Self-pay

## 2020-04-20 ENCOUNTER — Encounter (HOSPITAL_COMMUNITY): Payer: Self-pay | Admitting: Obstetrics and Gynecology

## 2020-04-20 ENCOUNTER — Inpatient Hospital Stay (HOSPITAL_COMMUNITY)
Admission: AD | Admit: 2020-04-20 | Discharge: 2020-04-20 | Disposition: A | Discharge status: Home / Self Care | Payer: No Typology Code available for payment source | Attending: Obstetrics and Gynecology | Admitting: Obstetrics and Gynecology

## 2020-04-20 DIAGNOSIS — K5900 Constipation, unspecified: Secondary | ICD-10-CM | POA: Insufficient documentation

## 2020-04-20 DIAGNOSIS — O99611 Diseases of the digestive system complicating pregnancy, first trimester: Secondary | ICD-10-CM | POA: Diagnosis present

## 2020-04-20 DIAGNOSIS — Z87891 Personal history of nicotine dependence: Secondary | ICD-10-CM | POA: Insufficient documentation

## 2020-04-20 DIAGNOSIS — O210 Mild hyperemesis gravidarum: Secondary | ICD-10-CM | POA: Insufficient documentation

## 2020-04-20 DIAGNOSIS — O09521 Supervision of elderly multigravida, first trimester: Secondary | ICD-10-CM | POA: Diagnosis not present

## 2020-04-20 DIAGNOSIS — Z79899 Other long term (current) drug therapy: Secondary | ICD-10-CM | POA: Diagnosis not present

## 2020-04-20 DIAGNOSIS — Z3A10 10 weeks gestation of pregnancy: Secondary | ICD-10-CM | POA: Insufficient documentation

## 2020-04-20 LAB — URINALYSIS, ROUTINE W REFLEX MICROSCOPIC
Bilirubin Urine: NEGATIVE
Glucose, UA: NEGATIVE mg/dL
Hgb urine dipstick: NEGATIVE
Ketones, ur: NEGATIVE mg/dL
Leukocytes,Ua: NEGATIVE
Nitrite: NEGATIVE
Protein, ur: NEGATIVE mg/dL
Specific Gravity, Urine: 1.01 (ref 1.005–1.030)
pH: 6 (ref 5.0–8.0)

## 2020-04-20 MED ORDER — FLEET ENEMA 7-19 GM/118ML RE ENEM: 1.0000 | ENEMA | Freq: Once | RECTAL | Status: DC

## 2020-04-20 MED ORDER — DOCUSATE SODIUM 100 MG PO CAPS
100.0000 mg | ORAL_CAPSULE | Freq: Two times a day (BID) | ORAL | 0 refills | Status: DC
Start: 2020-04-20 — End: 2020-06-25

## 2020-04-20 MED ORDER — POLYETHYLENE GLYCOL 3350 17 G PO PACK: 17.0000 g | PACK | Freq: Every day | ORAL | 0 refills | Status: DC

## 2020-04-20 NOTE — MAU Provider Note (Signed)
Chief Complaint: Abdominal Pain   First Provider Initiated Contact with Patient 04/20/20 1123     SUBJECTIVE HPI: Kristen Phillips is a 34 y.o. [id number] at [redacted]w[redacted]d who presents to Maternity Admissions reporting abdominal pain. Symptoms started a few days ago. Reports cramping & fullness throughout lower abdomen. Worse when eating or drinking. Last BM was over a week ago. Taking antiemetics including zofran. Not treating constipation. N/v has improved since her previous admission. Denies fever/chills, dysuria, or vaginal bleeding.   Location: lower abdomen Quality: cramping Severity: 10/10 on pain scale Duration: 2 days Timing: intermittent Modifying factors: worse with eating Associated signs and symptoms: constipation  Past Medical History:  Diagnosis Date  . Headache(784.0)    OB History  Gravida Para Term Preterm AB Living  6 2 1 1 3 2   SAB TAB Ectopic Multiple Live Births    3     1    # Outcome Date GA Lbr Len/2nd Weight Sex Delivery Anes PTL Lv  6 Current           5 Preterm 02/28/14 [redacted]w[redacted]d 06:28 / 00:17 2045 g F VBAC EPI  LIV  4 Term 04/15/04     CS-LTranv        Birth Comments: patient reports c/s due to low fluid and baby pressing on the cord  3 TAB           2 TAB           1 TAB            Past Surgical History:  Procedure Laterality Date  . CESAREAN SECTION    . INDUCED ABORTION     Social History   Socioeconomic History  . Marital status: Single    Spouse name: Not on file  . Number of children: Not on file  . Years of education: Not on file  . Highest education level: Not on file  Occupational History  . Not on file  Tobacco Use  . Smoking status: Former Smoker    Packs/day: 0.25    Years: 12.00    Pack years: 3.00    Types: Cigarettes    Quit date: 07/13/2013    Years since quitting: 6.7  . Smokeless tobacco: Never Used  Substance and Sexual Activity  . Alcohol use: No  . Drug use: No  . Sexual activity: Not Currently    Birth  control/protection: None    Comment: Several weeks since last intercourse  Other Topics Concern  . Not on file  Social History Narrative  . Not on file   Social Determinants of Health   Financial Resource Strain: Low Risk   . Difficulty of Paying Living Expenses: Not hard at all  Food Insecurity: No Food Insecurity  . Worried About 07/15/2013 in the Last Year: Never true  . Ran Out of Food in the Last Year: Never true  Transportation Needs: No Transportation Needs  . Lack of Transportation (Medical): No  . Lack of Transportation (Non-Medical): No  Physical Activity:   . Days of Exercise per Week:   . Minutes of Exercise per Session:   Stress: No Stress Concern Present  . Feeling of Stress : Only a little  Social Connections:   . Frequency of Communication with Friends and Family:   . Frequency of Social Gatherings with Friends and Family:   . Attends Religious Services:   . Active Member of Clubs or Organizations:   . Attends Programme researcher, broadcasting/film/video  Meetings:   Marland Kitchen Marital Status:   Intimate Partner Violence: Not At Risk  . Fear of Current or Ex-Partner: No  . Emotionally Abused: No  . Physically Abused: No  . Sexually Abused: No   Family History  Problem Relation Age of Onset  . Diabetes Mother   . Diabetes Maternal Grandmother   . Diabetes Paternal Grandmother    No current facility-administered medications on file prior to encounter.   Current Outpatient Medications on File Prior to Encounter  Medication Sig Dispense Refill  . glycopyrrolate (ROBINUL) 1 MG tablet Take 2 tablets (2 mg total) by mouth 3 (three) times daily as needed (spitting). 30 tablet 3  . methylPREDNISolone (MEDROL) 8 MG tablet Take as directed by mouth: 4/29 16 mg  4/30 16 mg-8 mg-8 mg 5/1 8mg -8mg -8mg   5/2 8mg -4 mg-8 mg 5/3 8 mg-4 mg-4mg  5/4 8mg -4 mg 5/5 8mg -4 mg 5/5 8mg   5/6 8mg  5/7 4mg  5/8 4mg  20 tablet 2  . ondansetron (ZOFRAN-ODT) 4 MG disintegrating tablet Take 1 tablet (4 mg total) by  mouth 4 (four) times daily as needed for nausea or vomiting. 30 tablet 2  . potassium chloride SA (KLOR-CON) 20 MEQ tablet Take 2 tablets (40 mEq total) by mouth daily. 30 tablet 1  . Prenatal Vit-Fe Fumarate-FA (PRENATAL MULTIVITAMIN) TABS tablet Take 1 tablet by mouth daily at 12 noon.    . prochlorperazine (COMPAZINE) 10 MG tablet Take 1 tablet (10 mg total) by mouth every 6 (six) hours as needed for nausea or vomiting. 30 tablet 2  . promethazine (PHENERGAN) 25 MG tablet Take 1 tablet (25 mg total) by mouth 4 (four) times daily as needed for nausea or vomiting. 30 tablet 2  . scopolamine (TRANSDERM-SCOP) 1 MG/3DAYS Place 1 patch (1.5 mg total) onto the skin every 3 (three) days. 10 patch 12   No Known Allergies  I have reviewed patient's Past Medical Hx, Surgical Hx, Family Hx, Social Hx, medications and allergies.   Review of Systems  Constitutional: Negative.   Gastrointestinal: Positive for abdominal pain, constipation and nausea. Negative for diarrhea, rectal pain and vomiting.  Genitourinary: Negative.     OBJECTIVE Patient Vitals for the past 24 hrs:  BP Temp Temp src Pulse Resp SpO2 Height Weight  04/20/20 1047 98/61 99 F (37.2 C) Oral 98 16 98 % - -  04/20/20 1044 - - - - - - 5\' 2"  (1.575 m) 66.1 kg   Constitutional: Well-developed, well-nourished female in no acute distress.  Cardiovascular: normal rate & rhythm, no murmur Respiratory: normal rate and effort. Lung sounds clear throughout GI: Abd soft, non-tender, Pos BS x 4. No guarding or rebound tenderness MS: Extremities nontender, no edema, normal ROM Neurologic: Alert and oriented x 4.    LAB RESULTS Results for orders placed or performed during the hospital encounter of 04/20/20 (from the past 24 hour(s))  Urinalysis, Routine w reflex microscopic     Status: None   Collection Time: 04/20/20 10:55 AM  Result Value Ref Range   Color, Urine YELLOW YELLOW   APPearance CLEAR CLEAR   Specific Gravity, Urine 1.010  1.005 - 1.030   pH 6.0 5.0 - 8.0   Glucose, UA NEGATIVE NEGATIVE mg/dL   Hgb urine dipstick NEGATIVE NEGATIVE   Bilirubin Urine NEGATIVE NEGATIVE   Ketones, ur NEGATIVE NEGATIVE mg/dL   Protein, ur NEGATIVE NEGATIVE mg/dL   Nitrite NEGATIVE NEGATIVE   Leukocytes,Ua NEGATIVE NEGATIVE    IMAGING No results found.  MAU COURSE Orders Placed This  Encounter  Procedures  . Urinalysis, Routine w reflex microscopic  . Soap suds enema  . Discharge patient   Meds ordered this encounter  Medications  . DISCONTD: sodium phosphate (FLEET) 7-19 GM/118ML enema 1 enema  . docusate sodium (COLACE) 100 MG capsule    Sig: Take 1 capsule (100 mg total) by mouth 2 (two) times daily.    Dispense:  60 capsule    Refill:  0    Order Specific Question:   Supervising Provider    Answer:   ERVIN, MICHAEL L [[id number]]  . polyethylene glycol (MIRALAX) 17 g packet    Sig: Take 17 g by mouth daily.    Dispense:  14 each    Refill:  0    Order Specific Question:   Supervising Provider    Answer:   Hermina Staggers [[id number]]    MDM RN unable to doppler FHTs, BSUS performed for viability  Pt informed that the ultrasound is considered a limited OB ultrasound and is not intended to be a complete ultrasound exam.  Patient also informed that the ultrasound is not being completed with the intent of assessing for fetal or placental anomalies or any pelvic abnormalities.  Explained that the purpose of today's ultrasound is to assess for  viability.  Patient acknowledges the purpose of the exam and the limitations of the study.  Active fetus. FHR 162 bpm  Abdomen soft & non tender  Offered enema in MAU.  Soap suds enema completed. Reports able to pass some stool with some relief of symptoms. Requesting to be discharged home. Discussed treatment of constipation at home. May consider discontinuing zofran for the time being.   ASSESSMENT 1. Constipation during pregnancy in first trimester   2. [redacted] weeks gestation of  pregnancy   3. Hyperemesis complicating pregnancy, antepartum     PLAN Discharge home in stable condition. Rx colace & miralax Constipation & clean out instructions provided   Allergies as of 04/20/2020   No Known Allergies     Medication List    TAKE these medications   docusate sodium 100 MG capsule Commonly known as: COLACE Take 1 capsule (100 mg total) by mouth 2 (two) times daily.   glycopyrrolate 1 MG tablet Commonly known as: ROBINUL Take 2 tablets (2 mg total) by mouth 3 (three) times daily as needed (spitting).   methylPREDNISolone 8 MG tablet Commonly known as: MEDROL Take as directed by mouth: 4/29 16 mg  4/30 16 mg-8 mg-8 mg 5/1 8mg -8mg -8mg   5/2 8mg -4 mg-8 mg 5/3 8 mg-4 mg-4mg  5/4 8mg -4 mg 5/5 8mg -4 mg 5/5 8mg   5/6 8mg  5/7 4mg  5/8 4mg    ondansetron 4 MG disintegrating tablet Commonly known as: ZOFRAN-ODT Take 1 tablet (4 mg total) by mouth 4 (four) times daily as needed for nausea or vomiting.   polyethylene glycol 17 g packet Commonly known as: MiraLax Take 17 g by mouth daily.   potassium chloride SA 20 MEQ tablet Commonly known as: KLOR-CON Take 2 tablets (40 mEq total) by mouth daily.   prenatal multivitamin Tabs tablet Take 1 tablet by mouth daily at 12 noon.   prochlorperazine 10 MG tablet Commonly known as: COMPAZINE Take 1 tablet (10 mg total) by mouth every 6 (six) hours as needed for nausea or vomiting.   promethazine 25 MG tablet Commonly known as: PHENERGAN Take 1 tablet (25 mg total) by mouth 4 (four) times daily as needed for nausea or vomiting.   scopolamine 1 MG/3DAYS Commonly known as: TRANSDERM-SCOP  Place 1 patch (1.5 mg total) onto the skin every 3 (three) days.        Judeth Horn, NP 04/20/2020  12:53 PM

## 2020-04-20 NOTE — MAU Note (Signed)
Kristen Phillips is a 34 y.o. at [redacted]w[redacted]d here in MAU reporting: abdominal pain for 2 days. States she has not had a BM in over a week. No bleeding or discharge.  Onset of complaint: 2 days  Pain score: 10/10  Vitals:   04/20/20 1047  BP: 98/61  Pulse: 98  Resp: 16  Temp: 99 F (37.2 C)  SpO2: 98%     Lab orders placed from triage: UA

## 2020-04-20 NOTE — Discharge Instructions (Signed)
You have constipation which is hard stools that are difficult to pass. It is important to have regular bowel movements every 1-3 days that are soft and easy to pass. Hard stools increase your risk of hemorrhoids and are very uncomfortable.   To prevent constipation you can increase the amount of fiber in your diet. Examples of foods with fiber are leafy greens, whole grain breads, oatmeal and other grains.  It is also important to drink at least eight 8oz glass of water everyday.   If you have not has a bowel movement in 4-5 days you made need to clean out your bowel.  This will have establish normal movement through your bowel.    Miralax Clean out  Take 8 capfuls of miralax in 64 oz of gatorade. You can use any fluid that appeals to you (gatorade, water, juice)  Continue to drink at least eight 8 oz glasses of water throughout the day  You can repeat with another 8 capfuls of miralax in 64 oz of gatorade if you are not having a large amount of stools  You will need to be at home and close to a bathroom for about 8 hours when you do the above as you may need to go to the bathroom frequently.   After you are cleaned out: - Start Colace100mg twice daily - Start Miralax once daily - Start a daily fiber supplement like metamucil or citrucel - You can safely use enemas in pregnancy  - if you are having diarrhea you can reduce to Colace once a day or miralax every other day or a 1/2 capful daily.       Constipation, Adult Constipation is when a person has fewer bowel movements in a week than normal, has difficulty having a bowel movement, or has stools that are dry, hard, or larger than normal. Constipation may be caused by an underlying condition. It may become worse with age if a person takes certain medicines and does not take in enough fluids. Follow these instructions at home: Eating and drinking   Eat foods that have a lot of fiber, such as fresh fruits and vegetables, whole grains,  and beans.  Limit foods that are high in fat, low in fiber, or overly processed, such as french fries, hamburgers, cookies, candies, and soda.  Drink enough fluid to keep your urine clear or pale yellow. General instructions  Exercise regularly or as told by your health care provider.  Go to the restroom when you have the urge to go. Do not hold it in.  Take over-the-counter and prescription medicines only as told by your health care provider. These include any fiber supplements.  Practice pelvic floor retraining exercises, such as deep breathing while relaxing the lower abdomen and pelvic floor relaxation during bowel movements.  Watch your condition for any changes.  Keep all follow-up visits as told by your health care provider. This is important. Contact a health care provider if:  You have pain that gets worse.  You have a fever.  You do not have a bowel movement after 4 days.  You vomit.  You are not hungry.  You lose weight.  You are bleeding from the anus.  You have thin, pencil-like stools. Get help right away if:  You have a fever and your symptoms suddenly get worse.  You leak stool or have blood in your stool.  Your abdomen is bloated.  You have severe pain in your abdomen.  You feel dizzy or you   faint. This information is not intended to replace advice given to you by your health care provider. Make sure you discuss any questions you have with your health care provider. Document Revised: 11/17/2017 Document Reviewed: 05/25/2016 Elsevier Patient Education  2020 Elsevier Inc.  

## 2020-04-23 ENCOUNTER — Ambulatory Visit (INDEPENDENT_AMBULATORY_CARE_PROVIDER_SITE_OTHER): Payer: No Typology Code available for payment source | Admitting: *Deleted

## 2020-04-23 ENCOUNTER — Other Ambulatory Visit: Payer: Self-pay

## 2020-04-23 DIAGNOSIS — O09529 Supervision of elderly multigravida, unspecified trimester: Secondary | ICD-10-CM

## 2020-04-23 DIAGNOSIS — O09899 Supervision of other high risk pregnancies, unspecified trimester: Secondary | ICD-10-CM

## 2020-04-23 DIAGNOSIS — O210 Mild hyperemesis gravidarum: Secondary | ICD-10-CM

## 2020-04-23 DIAGNOSIS — O0990 Supervision of high risk pregnancy, unspecified, unspecified trimester: Secondary | ICD-10-CM

## 2020-04-23 DIAGNOSIS — K5900 Constipation, unspecified: Secondary | ICD-10-CM | POA: Insufficient documentation

## 2020-04-23 NOTE — Progress Notes (Signed)
Chart reviewed for nurse visit. Agree with plan of care.   Marny Lowenstein, PA-C 04/23/2020 3:37 PM

## 2020-04-23 NOTE — Patient Instructions (Signed)

## 2020-04-23 NOTE — Progress Notes (Signed)
I connected with  Alliah A Katzman on 04/23/20 at  1:30 PM EDT by telephone and verified that I am speaking with the correct person using two identifiers.   I discussed the limitations, risks, security and privacy concerns of performing an evaluation and management service by telephone and the availability of in person appointments. I also discussed with the patient that there may be a patient responsible charge related to this service. The patient expressed understanding and agreed to proceed.  I explained I am completing her New OB Intake today. We discussed Her EDD and that it is based on  sure LMP . I reviewed her allergies, meds, OB History, Medical /Surgical history, and appropriate screenings. She informed me she is not taking any of her meds because of constipation and nausea. She did get the methyprednisone rx filled after discharge on 04/16/20. I discussed with Dr. Vergie Living and he does not recommend she get the methyprednisone now. Will need to follow up with doctor at next visit.  She reports she still has nausea and vomiting but feels she is keeping some fluids down. I advised her to go back to the hospital if she is not keeping anything down, or feels likes she is getting dehyrdrated or feeling lightheaded, dizzy or dry. She reports she doesn't think" she is there yet".   I informed her of [facility] services.  I explained I will send her the [employer] app and app was sent to her while on phone-she will complete later today.   I explained we will have her take her  blood pressure weekly during her pregnancy. She reports she has a cuff and knows how to use it. I asked her  If she has any questions about how to use it to bring the blood pressure cuff with her to her first ob appointment so we can show her how to use it. Explained  then we will have her take her blood pressure weekly and enter into the app.  I explained she will have some visits in office and some virtually. I sent her a text to sign  up for  MyChart app. She will complete later today.   I reviewed her new ob  appointment date/ time with her , our location and to wear mask, no visitors.  I explained she will have a pelvic exam, ob bloodwork, hemoglobin a1C, cbg ,pap, and  genetic testing if desired,- she is undecided re:  panorama.  She  Confirmed she was picking up her kids and needed to drive. I informed her we would need to end the call because I can not do visit while she is driving. I informed her I will schedule an [place] at 19 weeks and she can get the appointment when she has her new ob visit . She voices understanding.   Altheia Shafran,RN 04/23/2020  1:37 PM

## 2020-04-30 ENCOUNTER — Encounter: Payer: Self-pay | Admitting: Medical

## 2020-04-30 DIAGNOSIS — Z98891 History of uterine scar from previous surgery: Secondary | ICD-10-CM | POA: Insufficient documentation

## 2020-04-30 DIAGNOSIS — Z8759 Personal history of other complications of pregnancy, childbirth and the puerperium: Secondary | ICD-10-CM | POA: Insufficient documentation

## 2020-05-01 ENCOUNTER — Other Ambulatory Visit: Payer: Self-pay

## 2020-05-01 ENCOUNTER — Ambulatory Visit (INDEPENDENT_AMBULATORY_CARE_PROVIDER_SITE_OTHER): Payer: Medicaid Other | Admitting: Medical

## 2020-05-01 ENCOUNTER — Encounter: Payer: Self-pay | Admitting: Medical

## 2020-05-01 VITALS — BP 104/74 | HR 96 | Wt 137.5 lb

## 2020-05-01 DIAGNOSIS — Z8759 Personal history of other complications of pregnancy, childbirth and the puerperium: Secondary | ICD-10-CM

## 2020-05-01 DIAGNOSIS — Z3A11 11 weeks gestation of pregnancy: Secondary | ICD-10-CM

## 2020-05-01 DIAGNOSIS — O34219 Maternal care for unspecified type scar from previous cesarean delivery: Secondary | ICD-10-CM

## 2020-05-01 DIAGNOSIS — O210 Mild hyperemesis gravidarum: Secondary | ICD-10-CM

## 2020-05-01 DIAGNOSIS — O09211 Supervision of pregnancy with history of pre-term labor, first trimester: Secondary | ICD-10-CM | POA: Insufficient documentation

## 2020-05-01 DIAGNOSIS — O09521 Supervision of elderly multigravida, first trimester: Secondary | ICD-10-CM

## 2020-05-01 DIAGNOSIS — Z98891 History of uterine scar from previous surgery: Secondary | ICD-10-CM

## 2020-05-01 DIAGNOSIS — O0990 Supervision of high risk pregnancy, unspecified, unspecified trimester: Secondary | ICD-10-CM

## 2020-05-01 NOTE — Assessment & Plan Note (Signed)
36.6 weeks in 2015

## 2020-05-01 NOTE — Patient Instructions (Signed)

## 2020-05-01 NOTE — Progress Notes (Signed)
   PRENATAL VISIT NOTE  Subjective:  Kristen Phillips is a 34 y.o. [id number] at [redacted]w[redacted]d being seen today for her first prenatal visit for this pregnancy.  She is currently monitored for the following issues for this high-risk pregnancy and has Previous cesarean section complicating pregnancy, antepartum condition or complication; Hyperemesis complicating pregnancy, antepartum; Supervision of high risk pregnancy, antepartum; Constipation; Short interval between pregnancies affecting pregnancy, antepartum; AMA (advanced maternal age) multigravida 35+; History of prior pregnancy with SGA newborn; and History of VBAC on their problem list.  Patient reports nausea, vomiting and spitting.  Contractions: Not present. Vag. Bleeding: None.  Movement: Absent. Denies leaking of fluid.   She is planning to both breast and bottle feed. Desires contraception, unsure on method.   The following portions of the patient's history were reviewed and updated as appropriate: allergies, current medications, past family history, past medical history, past social history, past surgical history and problem list.   Objective:   Vitals:   05/01/20 1044  BP: 104/74  Pulse: 96  Weight: 137 lb 8 oz (62.4 kg)    Fetal Status: Fetal Heart Rate (bpm): 157   Movement: Absent     General:  Alert, oriented and cooperative. Patient is in no acute distress.  Skin: Skin is warm and dry. No rash noted.   Cardiovascular: Normal heart rate and rhythm noted  Respiratory: Normal respiratory effort, no problems with respiration noted. Clear to auscultation.   Abdomen: Soft, gravid, appropriate for gestational age. Normal bowel sounds. Non-tender. Pain/Pressure: Absent     Pelvic: Cervical exam performed Dilation: Closed Effacement (%): Thick   Normal cervical contour, no lesions, no bleeding following pap, normal discharge  Extremities: Normal range of motion.  Edema: None  Mental Status: Normal mood and affect. Normal behavior.  Normal judgment and thought content.   Assessment and Plan:  Pregnancy: [id number] at [redacted]w[redacted]d 1. Supervision of high risk pregnancy, antepartum - OB Panel - Genetic testing  - GC/Chlamydia  - Patient had [facility] pap at [facility] in 2020, ROI completed   2. History of VBAC - Planning TOLAC  3. Multigravida of advanced maternal age in first trimester - < 60 years old, now change in management   4. Hyperemesis complicating pregnancy, antepartum - Continuing Zofran PRN - Symptoms are improving, now intermittent  - Was admitted at 9 weeks   5. History of prior pregnancy with SGA newborn - Born at 36.6 weeks  - Anatomy [place] scheduled 06/23/20  6. Previous cesarean section complicating pregnancy, antepartum condition or complication - Planning TOLAC   Preterm labor/first trimester warning symptoms and general obstetric precautions including but not limited to vaginal bleeding, contractions, leaking of fluid and fetal movement were reviewed in detail with the patient. Please refer to After Visit Summary for other counseling recommendations.   Discussed the normal visit cadence for prenatal care Discussed the nature of our practice with multiple providers including residents and students   Return in about 4 weeks (around 05/29/2020) for LOB, Virtual.  Future Appointments  Date Time Provider Department Center  06/23/2020  8:30 AM [facility] NURSE [facility]  06/23/2020  8:30 AM [facility]3 [facility]    Vonzella Nipple, PA-C

## 2020-05-02 LAB — CBC/D/PLT+RPR+RH+ABO+RUB AB...
Antibody Screen: NEGATIVE
Basophils Absolute: 0.1 10*3/uL (ref 0.0–0.2)
Basos: 1 %
EOS (ABSOLUTE): 0.1 10*3/uL (ref 0.0–0.4)
Eos: 1 %
HCV Ab: <0.1 s/co ratio (ref 0.0–0.9)
HIV Screen 4th Generation wRfx: NONREACTIVE
Hematocrit: 36.4 % (ref 34.0–46.6)
Hemoglobin: 12.1 g/dL (ref 11.1–15.9)
Hepatitis B Surface Ag: NEGATIVE
Immature Grans (Abs): 0 10*3/uL (ref 0.0–0.1)
Immature Granulocytes: 0 %
Lymphocytes Absolute: 2.1 10*3/uL (ref 0.7–3.1)
Lymphs: 23 %
MCH: 33.9 pg — ABNORMAL HIGH (ref 26.6–33.0)
MCHC: 33.2 g/dL (ref 31.5–35.7)
MCV: 102 fL — ABNORMAL HIGH (ref 79–97)
Monocytes Absolute: 0.6 10*3/uL (ref 0.1–0.9)
Monocytes: 7 %
Neutrophils Absolute: 6.1 10*3/uL (ref 1.4–7.0)
Neutrophils: 68 %
Platelets: 297 10*3/uL (ref 150–450)
RBC: 3.57 x10E6/uL — ABNORMAL LOW (ref 3.77–5.28)
RDW: 11.9 % (ref 11.7–15.4)
RPR Ser Ql: NONREACTIVE
Rh Factor: POSITIVE
Rubella Antibodies, IGG: 2.92 index (ref >0.99)
WBC: 9 10*3/uL (ref 3.4–10.8)

## 2020-05-02 LAB — HCV INTERPRETATION

## 2020-05-19 ENCOUNTER — Encounter: Payer: Self-pay | Admitting: *Deleted

## 2020-05-20 ENCOUNTER — Encounter: Payer: Self-pay | Admitting: *Deleted

## 2020-05-28 ENCOUNTER — Other Ambulatory Visit: Payer: Self-pay

## 2020-05-28 ENCOUNTER — Telehealth (INDEPENDENT_AMBULATORY_CARE_PROVIDER_SITE_OTHER): Payer: PRIVATE HEALTH INSURANCE | Admitting: Obstetrics and Gynecology

## 2020-05-28 DIAGNOSIS — O0992 Supervision of high risk pregnancy, unspecified, second trimester: Secondary | ICD-10-CM

## 2020-05-28 DIAGNOSIS — Z3A15 15 weeks gestation of pregnancy: Secondary | ICD-10-CM

## 2020-05-28 DIAGNOSIS — Z98891 History of uterine scar from previous surgery: Secondary | ICD-10-CM

## 2020-05-28 DIAGNOSIS — O34219 Maternal care for unspecified type scar from previous cesarean delivery: Secondary | ICD-10-CM | POA: Diagnosis not present

## 2020-05-28 DIAGNOSIS — O0990 Supervision of high risk pregnancy, unspecified, unspecified trimester: Secondary | ICD-10-CM

## 2020-05-28 MED ORDER — ONDANSETRON 4 MG PO TBDP: 4.0000 mg | ORAL_TABLET | Freq: Four times a day (QID) | ORAL | 2 refills | Status: DC | PRN

## 2020-05-28 NOTE — Progress Notes (Signed)
I connected with  Tabia A Falk on 05/28/20 at  9:15 AM EDT by telephone and verified that I am speaking with the correct person using two identifiers.   I discussed the limitations, risks, security and privacy concerns of performing an evaluation and management service by telephone and the availability of in person appointments. I also discussed with the patient that there may be a patient responsible charge related to this service. The patient expressed understanding and agreed to proceed.  Henrietta Dine, CMA 05/28/2020  9:07 AM

## 2020-05-28 NOTE — Progress Notes (Signed)
   TELEHEALTH OBSTETRICS VISIT ENCOUNTER NOTE  I connected with Kristen Phillips on 05/28/20 at  9:15 AM EDT by telephone at home and verified that I am speaking with the correct person using two identifiers.   I discussed the limitations, risks, security and privacy concerns of performing an evaluation and management service by telephone and the availability of in person appointments. I also discussed with the patient that there may be a patient responsible charge related to this service. The patient expressed understanding and agreed to proceed.  Subjective:  Kristen Phillips is a 34 y.o. [id number] at [redacted]w[redacted]d being followed for ongoing prenatal care.  She is currently monitored for the following issues for this low-risk pregnancy and has Previous cesarean section complicating pregnancy, antepartum condition or complication; Hyperemesis complicating pregnancy, antepartum; Supervision of high risk pregnancy, antepartum; Constipation; Short interval between pregnancies affecting pregnancy, antepartum; AMA (advanced maternal age) multigravida 35+; History of prior pregnancy with SGA newborn; History of VBAC; and Previous preterm delivery, antepartum, first trimester on their problem list.  Patient reports no complaints. Continued nausea and vomiting, however much improved. Reports fetal movement. Denies any contractions, bleeding or leaking of fluid.   The following portions of the patient's history were reviewed and updated as appropriate: allergies, current medications, past family history, past medical history, past social history, past surgical history and problem list.   Objective:   General:  Alert, oriented and cooperative.   Mental Status: Normal mood and affect perceived. Normal judgment and thought content.  Rest of physical exam deferred due to type of encounter  Assessment and Plan:  Pregnancy: [id number] at [redacted]w[redacted]d  1. History of VBAC  Plans for TOLAC Previous vaginal delivery.    2. Supervision of high risk pregnancy, antepartum  Doing well.  BP 123/80 Nausea is better controlled. Using Zofran as needed. Refill requested.  Partner is getting vasectomy.    Preterm labor symptoms and general obstetric precautions including but not limited to vaginal bleeding, contractions, leaking of fluid and fetal movement were reviewed in detail with the patient.  I discussed the assessment and treatment plan with the patient. The patient was provided an opportunity to ask questions and all were answered. The patient agreed with the plan and demonstrated an understanding of the instructions. The patient was advised to call back or seek an in-person office evaluation/go to MAU at [facility] for any urgent or concerning symptoms. Please refer to After Visit Summary for other counseling recommendations.   I provided 9 minutes of non-face-to-face time during this encounter.  Return in about 4 weeks (around 06/25/2020) for In person or virtual is fine. .  Future Appointments  Date Time Provider Department Center  06/23/2020  8:30 AM [facility] NURSE [facility]  06/23/2020  8:30 AM [facility]3 [facility]    Venia Carbon, NP Center for [employer], [facility]

## 2020-06-16 ENCOUNTER — Other Ambulatory Visit: Payer: Self-pay

## 2020-06-16 ENCOUNTER — Other Ambulatory Visit: Payer: Self-pay | Admitting: Obstetrics & Gynecology

## 2020-06-16 ENCOUNTER — Encounter: Payer: Self-pay | Admitting: Medical

## 2020-06-16 NOTE — Progress Notes (Signed)
Opened in Error.

## 2020-06-23 ENCOUNTER — Ambulatory Visit: Payer: PRIVATE HEALTH INSURANCE | Admitting: *Deleted

## 2020-06-23 ENCOUNTER — Other Ambulatory Visit: Payer: Self-pay | Admitting: *Deleted

## 2020-06-23 ENCOUNTER — Ambulatory Visit: Payer: PRIVATE HEALTH INSURANCE | Attending: Medical

## 2020-06-23 ENCOUNTER — Other Ambulatory Visit: Payer: Self-pay

## 2020-06-23 DIAGNOSIS — O43192 Other malformation of placenta, second trimester: Secondary | ICD-10-CM

## 2020-06-23 DIAGNOSIS — Z363 Encounter for antenatal screening for malformations: Secondary | ICD-10-CM

## 2020-06-23 DIAGNOSIS — O09529 Supervision of elderly multigravida, unspecified trimester: Secondary | ICD-10-CM | POA: Diagnosis present

## 2020-06-23 DIAGNOSIS — O09292 Supervision of pregnancy with other poor reproductive or obstetric history, second trimester: Secondary | ICD-10-CM

## 2020-06-23 DIAGNOSIS — O0990 Supervision of high risk pregnancy, unspecified, unspecified trimester: Secondary | ICD-10-CM | POA: Diagnosis present

## 2020-06-23 DIAGNOSIS — O09522 Supervision of elderly multigravida, second trimester: Secondary | ICD-10-CM | POA: Diagnosis present

## 2020-06-23 DIAGNOSIS — Z98891 History of uterine scar from previous surgery: Secondary | ICD-10-CM | POA: Diagnosis present

## 2020-06-23 DIAGNOSIS — O210 Mild hyperemesis gravidarum: Secondary | ICD-10-CM | POA: Insufficient documentation

## 2020-06-23 DIAGNOSIS — Z3A19 19 weeks gestation of pregnancy: Secondary | ICD-10-CM

## 2020-06-23 DIAGNOSIS — O09899 Supervision of other high risk pregnancies, unspecified trimester: Secondary | ICD-10-CM | POA: Insufficient documentation

## 2020-06-23 DIAGNOSIS — K5900 Constipation, unspecified: Secondary | ICD-10-CM

## 2020-06-23 DIAGNOSIS — Z8759 Personal history of other complications of pregnancy, childbirth and the puerperium: Secondary | ICD-10-CM | POA: Insufficient documentation

## 2020-06-23 DIAGNOSIS — O09212 Supervision of pregnancy with history of pre-term labor, second trimester: Secondary | ICD-10-CM

## 2020-06-23 DIAGNOSIS — O34219 Maternal care for unspecified type scar from previous cesarean delivery: Secondary | ICD-10-CM

## 2020-06-24 ENCOUNTER — Encounter: Payer: Self-pay | Admitting: Medical

## 2020-06-24 DIAGNOSIS — O43192 Other malformation of placenta, second trimester: Secondary | ICD-10-CM | POA: Insufficient documentation

## 2020-06-25 ENCOUNTER — Encounter: Payer: Self-pay | Admitting: Obstetrics and Gynecology

## 2020-06-25 ENCOUNTER — Telehealth (INDEPENDENT_AMBULATORY_CARE_PROVIDER_SITE_OTHER): Payer: PRIVATE HEALTH INSURANCE | Admitting: Obstetrics and Gynecology

## 2020-06-25 ENCOUNTER — Other Ambulatory Visit: Payer: Self-pay

## 2020-06-25 VITALS — BP 105/65 | HR 109

## 2020-06-25 DIAGNOSIS — O09523 Supervision of elderly multigravida, third trimester: Secondary | ICD-10-CM

## 2020-06-25 DIAGNOSIS — O0993 Supervision of high risk pregnancy, unspecified, third trimester: Secondary | ICD-10-CM

## 2020-06-25 DIAGNOSIS — O0990 Supervision of high risk pregnancy, unspecified, unspecified trimester: Secondary | ICD-10-CM

## 2020-06-25 DIAGNOSIS — O09522 Supervision of elderly multigravida, second trimester: Secondary | ICD-10-CM

## 2020-06-25 DIAGNOSIS — O43192 Other malformation of placenta, second trimester: Secondary | ICD-10-CM | POA: Diagnosis not present

## 2020-06-25 DIAGNOSIS — Z98891 History of uterine scar from previous surgery: Secondary | ICD-10-CM

## 2020-06-25 DIAGNOSIS — Z3A36 36 weeks gestation of pregnancy: Secondary | ICD-10-CM | POA: Diagnosis not present

## 2020-06-25 DIAGNOSIS — Z8759 Personal history of other complications of pregnancy, childbirth and the puerperium: Secondary | ICD-10-CM

## 2020-06-25 MED ORDER — ONDANSETRON 4 MG PO TBDP: 4.0000 mg | ORAL_TABLET | Freq: Four times a day (QID) | ORAL | 2 refills | Status: DC | PRN

## 2020-06-25 NOTE — Progress Notes (Signed)
I connected with  Kristen Phillips on 06/25/20 at 10:55 AM EDT by mychart video and verified that I am speaking with the correct person using two identifiers.   I discussed the limitations, risks, security and privacy concerns of performing an evaluation and management service by telephone and the availability of in person appointments. I also discussed with the patient that there may be a patient responsible charge related to this service. The patient expressed understanding and agreed to proceed.  Marylynn Pearson, RN 06/25/2020  11:08 AM

## 2020-06-25 NOTE — Progress Notes (Signed)
° °  TELEHEALTH OBSTETRICS VISIT ENCOUNTER NOTE  I connected with Kristen Phillips on 06/25/20 at 10:55 AM EDT by telephone at home and verified that I am speaking with the correct person using two identifiers.   I discussed the limitations, risks, security and privacy concerns of performing an evaluation and management service by telephone and the availability of in person appointments. I also discussed with the patient that there may be a patient responsible charge related to this service. The patient expressed understanding and agreed to proceed.  Subjective:  Kristen Phillips is a 34 y.o. [id number] at [redacted]w[redacted]d being followed for ongoing prenatal care.  She is currently monitored for the following issues for this low-risk pregnancy and has Previous cesarean section complicating pregnancy, antepartum condition or complication; Hyperemesis complicating pregnancy, antepartum; Supervision of high risk pregnancy, antepartum; Constipation; Short interval between pregnancies affecting pregnancy, antepartum; AMA (advanced maternal age) multigravida 35+; History of prior pregnancy with SGA newborn; History of VBAC; Previous preterm delivery, antepartum, first trimester; and Marginal insertion of umbilical cord affecting management of mother in second trimester on their problem list.  Patient reports no complaints. Reports fetal movement. Denies any contractions, bleeding or leaking of fluid.   The following portions of the patient's history were reviewed and updated as appropriate: allergies, current medications, past family history, past medical history, past social history, past surgical history and problem list.   Objective:   General:  Alert, oriented and cooperative.   Mental Status: Normal mood and affect perceived. Normal judgment and thought content.  Rest of physical exam deferred due to type of encounter  Assessment and Plan:  Pregnancy: [id number] at [redacted]w[redacted]d  1. Marginal insertion of umbilical  cord affecting management of mother in second trimester  [place] scheduled for 8/3  2. History of VBAC  Previous VBAC   3. Supervision of high risk pregnancy, antepartum  Nausea is improving. Rx: Zofran - refill   4. Multigravida of advanced maternal age in second trimester  Partner getting vasectomy.   Preterm labor symptoms and general obstetric precautions including but not limited to vaginal bleeding, contractions, leaking of fluid and fetal movement were reviewed in detail with the patient.  I discussed the assessment and treatment plan with the patient. The patient was provided an opportunity to ask questions and all were answered. The patient agreed with the plan and demonstrated an understanding of the instructions. The patient was advised to call back or seek an in-person office evaluation/go to [facility] at [facility] for any urgent or concerning symptoms. Please refer to After Visit Summary for other counseling recommendations.   I provided 10 minutes of non-face-to-face time during this encounter.  Return in about 4 weeks (around 07/23/2020) for In person visit per patient request. APP schedule ok .  Future Appointments  Date Time Provider Department Center  07/21/2020  9:30 AM [facility] NURSE [facility]  07/21/2020  9:45 AM [facility] US5 [facility]    Venia Carbon, NP Center for [employer], [facility]

## 2020-07-21 ENCOUNTER — Other Ambulatory Visit: Payer: Self-pay

## 2020-07-21 ENCOUNTER — Ambulatory Visit: Payer: PRIVATE HEALTH INSURANCE | Attending: Maternal & Fetal Medicine

## 2020-07-21 ENCOUNTER — Other Ambulatory Visit: Payer: Self-pay | Admitting: *Deleted

## 2020-07-21 ENCOUNTER — Ambulatory Visit: Payer: PRIVATE HEALTH INSURANCE | Admitting: *Deleted

## 2020-07-21 DIAGNOSIS — Z98891 History of uterine scar from previous surgery: Secondary | ICD-10-CM | POA: Insufficient documentation

## 2020-07-21 DIAGNOSIS — O43192 Other malformation of placenta, second trimester: Secondary | ICD-10-CM

## 2020-07-21 DIAGNOSIS — O34219 Maternal care for unspecified type scar from previous cesarean delivery: Secondary | ICD-10-CM

## 2020-07-21 DIAGNOSIS — O09899 Supervision of other high risk pregnancies, unspecified trimester: Secondary | ICD-10-CM | POA: Insufficient documentation

## 2020-07-21 DIAGNOSIS — O0990 Supervision of high risk pregnancy, unspecified, unspecified trimester: Secondary | ICD-10-CM | POA: Diagnosis present

## 2020-07-21 DIAGNOSIS — O09292 Supervision of pregnancy with other poor reproductive or obstetric history, second trimester: Secondary | ICD-10-CM | POA: Diagnosis not present

## 2020-07-21 DIAGNOSIS — O09522 Supervision of elderly multigravida, second trimester: Secondary | ICD-10-CM | POA: Diagnosis present

## 2020-07-21 DIAGNOSIS — Z8759 Personal history of other complications of pregnancy, childbirth and the puerperium: Secondary | ICD-10-CM

## 2020-07-21 DIAGNOSIS — K5900 Constipation, unspecified: Secondary | ICD-10-CM | POA: Insufficient documentation

## 2020-07-21 DIAGNOSIS — O09212 Supervision of pregnancy with history of pre-term labor, second trimester: Secondary | ICD-10-CM | POA: Diagnosis not present

## 2020-07-21 DIAGNOSIS — Z363 Encounter for antenatal screening for malformations: Secondary | ICD-10-CM

## 2020-07-21 DIAGNOSIS — Z3A23 23 weeks gestation of pregnancy: Secondary | ICD-10-CM

## 2020-07-23 ENCOUNTER — Telehealth: Payer: Self-pay

## 2020-07-23 NOTE — Telephone Encounter (Signed)
Attempted to reach patient about her missed appointment. Left a message for her to call and reschedule.

## 2020-08-06 ENCOUNTER — Encounter: Payer: Self-pay | Admitting: Medical

## 2020-08-07 MED ORDER — ONDANSETRON 4 MG PO TBDP
4.0000 mg | ORAL_TABLET | Freq: Four times a day (QID) | ORAL | 2 refills | Status: DC | PRN
Start: 2020-08-07 — End: 2020-10-22

## 2020-08-18 ENCOUNTER — Other Ambulatory Visit: Payer: Self-pay

## 2020-08-18 ENCOUNTER — Ambulatory Visit: Payer: PRIVATE HEALTH INSURANCE | Attending: Obstetrics and Gynecology

## 2020-08-18 ENCOUNTER — Ambulatory Visit: Payer: PRIVATE HEALTH INSURANCE | Admitting: *Deleted

## 2020-08-18 ENCOUNTER — Encounter: Payer: Self-pay | Admitting: *Deleted

## 2020-08-18 ENCOUNTER — Other Ambulatory Visit: Payer: Self-pay | Admitting: *Deleted

## 2020-08-18 DIAGNOSIS — O09899 Supervision of other high risk pregnancies, unspecified trimester: Secondary | ICD-10-CM | POA: Insufficient documentation

## 2020-08-18 DIAGNOSIS — Z8759 Personal history of other complications of pregnancy, childbirth and the puerperium: Secondary | ICD-10-CM

## 2020-08-18 DIAGNOSIS — K5900 Constipation, unspecified: Secondary | ICD-10-CM | POA: Insufficient documentation

## 2020-08-18 DIAGNOSIS — O34219 Maternal care for unspecified type scar from previous cesarean delivery: Secondary | ICD-10-CM

## 2020-08-18 DIAGNOSIS — O0990 Supervision of high risk pregnancy, unspecified, unspecified trimester: Secondary | ICD-10-CM | POA: Diagnosis present

## 2020-08-18 DIAGNOSIS — Z98891 History of uterine scar from previous surgery: Secondary | ICD-10-CM | POA: Diagnosis present

## 2020-08-18 DIAGNOSIS — Z363 Encounter for antenatal screening for malformations: Secondary | ICD-10-CM | POA: Diagnosis not present

## 2020-08-18 DIAGNOSIS — O43199 Other malformation of placenta, unspecified trimester: Secondary | ICD-10-CM

## 2020-08-18 DIAGNOSIS — O43192 Other malformation of placenta, second trimester: Secondary | ICD-10-CM

## 2020-08-18 DIAGNOSIS — Z3A27 27 weeks gestation of pregnancy: Secondary | ICD-10-CM

## 2020-08-18 DIAGNOSIS — O09522 Supervision of elderly multigravida, second trimester: Secondary | ICD-10-CM

## 2020-08-18 DIAGNOSIS — O09292 Supervision of pregnancy with other poor reproductive or obstetric history, second trimester: Secondary | ICD-10-CM

## 2020-08-18 DIAGNOSIS — O09212 Supervision of pregnancy with history of pre-term labor, second trimester: Secondary | ICD-10-CM

## 2020-08-19 ENCOUNTER — Encounter: Payer: Self-pay | Admitting: Medical

## 2020-09-22 ENCOUNTER — Other Ambulatory Visit: Payer: Self-pay | Admitting: Maternal & Fetal Medicine

## 2020-09-22 ENCOUNTER — Ambulatory Visit: Payer: PRIVATE HEALTH INSURANCE | Attending: Obstetrics and Gynecology

## 2020-09-22 ENCOUNTER — Other Ambulatory Visit: Payer: Self-pay | Admitting: *Deleted

## 2020-09-22 ENCOUNTER — Other Ambulatory Visit: Payer: Self-pay

## 2020-09-22 ENCOUNTER — Encounter: Payer: Self-pay | Admitting: *Deleted

## 2020-09-22 ENCOUNTER — Ambulatory Visit: Payer: PRIVATE HEALTH INSURANCE | Admitting: *Deleted

## 2020-09-22 DIAGNOSIS — K5900 Constipation, unspecified: Secondary | ICD-10-CM | POA: Diagnosis present

## 2020-09-22 DIAGNOSIS — O09299 Supervision of pregnancy with other poor reproductive or obstetric history, unspecified trimester: Secondary | ICD-10-CM

## 2020-09-22 DIAGNOSIS — Z8759 Personal history of other complications of pregnancy, childbirth and the puerperium: Secondary | ICD-10-CM | POA: Insufficient documentation

## 2020-09-22 DIAGNOSIS — O09213 Supervision of pregnancy with history of pre-term labor, third trimester: Secondary | ICD-10-CM | POA: Diagnosis not present

## 2020-09-22 DIAGNOSIS — O09899 Supervision of other high risk pregnancies, unspecified trimester: Secondary | ICD-10-CM | POA: Insufficient documentation

## 2020-09-22 DIAGNOSIS — O43199 Other malformation of placenta, unspecified trimester: Secondary | ICD-10-CM | POA: Diagnosis present

## 2020-09-22 DIAGNOSIS — O09523 Supervision of elderly multigravida, third trimester: Secondary | ICD-10-CM | POA: Diagnosis not present

## 2020-09-22 DIAGNOSIS — Z3A32 32 weeks gestation of pregnancy: Secondary | ICD-10-CM

## 2020-09-22 DIAGNOSIS — O0990 Supervision of high risk pregnancy, unspecified, unspecified trimester: Secondary | ICD-10-CM | POA: Diagnosis present

## 2020-09-22 DIAGNOSIS — Z98891 History of uterine scar from previous surgery: Secondary | ICD-10-CM

## 2020-09-22 DIAGNOSIS — O43192 Other malformation of placenta, second trimester: Secondary | ICD-10-CM | POA: Insufficient documentation

## 2020-09-22 DIAGNOSIS — O34219 Maternal care for unspecified type scar from previous cesarean delivery: Secondary | ICD-10-CM

## 2020-09-22 DIAGNOSIS — Z361 Encounter for antenatal screening for raised alphafetoprotein level: Secondary | ICD-10-CM

## 2020-09-23 ENCOUNTER — Encounter: Payer: Self-pay | Admitting: Medical

## 2020-09-24 ENCOUNTER — Ambulatory Visit (INDEPENDENT_AMBULATORY_CARE_PROVIDER_SITE_OTHER): Payer: PRIVATE HEALTH INSURANCE | Admitting: Obstetrics & Gynecology

## 2020-09-24 ENCOUNTER — Other Ambulatory Visit: Payer: Self-pay

## 2020-09-24 VITALS — BP 114/67 | HR 87 | Wt 150.8 lb

## 2020-09-24 DIAGNOSIS — O0990 Supervision of high risk pregnancy, unspecified, unspecified trimester: Secondary | ICD-10-CM

## 2020-09-24 DIAGNOSIS — Z98891 History of uterine scar from previous surgery: Secondary | ICD-10-CM

## 2020-09-24 NOTE — Patient Instructions (Signed)
Vaginal Birth After Cesarean Delivery  Vaginal birth after cesarean delivery (VBAC) is giving birth vaginally after previously delivering a baby through a cesarean section (C-section). A VBAC may be a safe option for you, depending on your health and other factors. It is important to discuss VBAC with your health care provider early in your pregnancy so you can understand the risks, benefits, and options. Having these discussions early will give you time to make your birth plan. Who are the best candidates for VBAC? The best candidates for VBAC are women who:  Have had one or two prior cesarean deliveries, and the incision made during the delivery was horizontal (low transverse).  Do not have a vertical (classical) scar on their uterus.  Have not had a tear in the wall of their uterus (uterine rupture).  Plan to have more pregnancies. A VBAC is also more likely to be successful:  In women who have previously given birth vaginally.  When labor starts by itself (spontaneously) before the due date. What are the benefits of VBAC? The benefits of delivering your baby vaginally instead of by a cesarean delivery include:  A shorter hospital stay.  A faster recovery time.  Less pain.  Avoiding risks associated with major surgery, such as infection and blood clots.  Less blood loss and less need for donated blood (transfusions). What are the risks of VBAC? The main risk of attempting a VBAC is that it may fail, forcing your health care provider to deliver your baby by a C-section. Other risks are rare and include:  Tearing (rupture) of the scar from a past cesarean delivery.  Other risks associated with vaginal deliveries. If a repeat cesarean delivery is needed, the risks include:  Blood loss.  Infection.  Blood clot.  Damage to surrounding organs.  Removal of the uterus (hysterectomy), if it is damaged.  Placenta problems in future pregnancies. What else should I know  about my options? Delivering a baby through a VBAC is similar to having a normal spontaneous vaginal delivery. Therefore, it is safe:  To try with twins.  For your health care provider to try to turn the baby from a breech position (external cephalic version) during labor.  With epidural analgesia for pain relief. Consider where you would like to deliver your baby. VBAC should be attempted in facilities where an emergency cesarean delivery can be performed. VBAC is not recommended for home births. Any changes in your health or your baby's health during your pregnancy may make it necessary to change your initial decision about VBAC. Your health care provider may recommend that you do not attempt a VBAC if:  Your baby's suspected weight is 8.8 lb (4 kg) or more.  You have preeclampsia. This is a condition that causes high blood pressure along with other symptoms, such as swelling and headaches.  You will have VBAC less than 19 months after your cesarean delivery.  You are past your due date.  You need to have labor started (induced) because your cervix is not ready for labor (unfavorable). Where to find more information  American Pregnancy Association: americanpregnancy.org  American Congress of Obstetricians and Gynecologists: acog.org Summary  Vaginal birth after cesarean delivery (VBAC) is giving birth vaginally after previously delivering a baby through a cesarean section (C-section). A VBAC may be a safe option for you, depending on your health and other factors.  Discuss VBAC with your health care provider early in your pregnancy so you can understand the risks, benefits, options, and   have plenty of time to make your birth plan.  The main risk of attempting a VBAC is that it may fail, forcing your health care provider to deliver your baby by a C-section. Other risks are rare. This information is not intended to replace advice given to you by your health care provider. Make sure  you discuss any questions you have with your health care provider. Document Revised: 04/02/2019 Document Reviewed: 03/14/2017 Elsevier Patient Education  2020 Elsevier Inc.  

## 2020-09-24 NOTE — Progress Notes (Signed)
   PRENATAL VISIT NOTE  Subjective:  Kristen Phillips is a 34 y.o. [id number] at [redacted]w[redacted]d being seen today for ongoing prenatal care.  She is currently monitored for the following issues for this high-risk pregnancy and has Previous cesarean section complicating pregnancy, antepartum condition or complication; Hyperemesis complicating pregnancy, antepartum; Supervision of high risk pregnancy, antepartum; Constipation; Short interval between pregnancies affecting pregnancy, antepartum; AMA (advanced maternal age) multigravida 35+; History of prior pregnancy with SGA newborn; History of VBAC; Previous preterm delivery, antepartum, first trimester; and Marginal insertion of umbilical cord affecting management of mother in second trimester on their problem list.  Patient reports no complaints.  Contractions: Irritability. Vag. Bleeding: Bloody Show.  Movement: Present. Denies leaking of fluid.   The following portions of the patient's history were reviewed and updated as appropriate: allergies, current medications, past family history, past medical history, past social history, past surgical history and problem list.   Objective:   Vitals:   09/24/20 1526  BP: 114/67  Pulse: 87  Weight: 150 lb 12.8 oz (68.4 kg)    Fetal Status:     Movement: Present     General:  Alert, oriented and cooperative. Patient is in no acute distress.  Skin: Skin is warm and dry. No rash noted.   Cardiovascular: Normal heart rate noted  Respiratory: Normal respiratory effort, no problems with respiration noted  Abdomen: Soft, gravid, appropriate for gestational age.  Pain/Pressure: Present     Pelvic: Cervical exam deferred        Extremities: Normal range of motion.  Edema: None  Mental Status: Normal mood and affect. Normal behavior. Normal judgment and thought content.   Assessment and Plan:  Pregnancy: P6P9509 at [redacted]w[redacted]d 1. Supervision of high risk pregnancy, antepartum Declined flu, TDAP and Covid vaccine (Prev  COVID infection 01/2020) - Glucose Tolerance, 1 Hour - CBC - HIV Antibody (routine testing w rflx) - RPR  2. History of VBAC TOLAC consent 10/7  Preterm labor symptoms and general obstetric precautions including but not limited to vaginal bleeding, contractions, leaking of fluid and fetal movement were reviewed in detail with the patient. Please refer to After Visit Summary for other counseling recommendations.   Return in 2 weeks (on 10/08/2020).  Future Appointments  Date Time Provider Department Center  09/29/2020  3:15 PM [facility] NURSE [facility]  09/29/2020  3:30 PM [facility] US3 [facility]    Malachy Chamber, MD

## 2020-09-25 LAB — CBC
Hematocrit: 31.2 % — ABNORMAL LOW (ref 34.0–46.6)
Hemoglobin: 10.6 g/dL — ABNORMAL LOW (ref 11.1–15.9)
MCH: 33.2 pg — ABNORMAL HIGH (ref 26.6–33.0)
MCHC: 34 g/dL (ref 31.5–35.7)
MCV: 98 fL — ABNORMAL HIGH (ref 79–97)
Platelets: 269 10*3/uL (ref 150–450)
RBC: 3.19 x10E6/uL — ABNORMAL LOW (ref 3.77–5.28)
RDW: 12 % (ref 11.7–15.4)
WBC: 9 10*3/uL (ref 3.4–10.8)

## 2020-09-25 LAB — HIV ANTIBODY (ROUTINE TESTING W REFLEX): HIV Screen 4th Generation wRfx: NONREACTIVE

## 2020-09-25 LAB — GLUCOSE TOLERANCE, 1 HOUR: Glucose, 1Hr PP: 109 mg/dL (ref 65–199)

## 2020-09-25 LAB — RPR: RPR Ser Ql: NONREACTIVE

## 2020-09-29 ENCOUNTER — Encounter: Payer: Self-pay | Admitting: *Deleted

## 2020-09-29 ENCOUNTER — Ambulatory Visit: Payer: PRIVATE HEALTH INSURANCE | Admitting: *Deleted

## 2020-09-29 ENCOUNTER — Ambulatory Visit: Payer: PRIVATE HEALTH INSURANCE | Attending: Obstetrics and Gynecology

## 2020-09-29 ENCOUNTER — Other Ambulatory Visit: Payer: Self-pay

## 2020-09-29 DIAGNOSIS — O43192 Other malformation of placenta, second trimester: Secondary | ICD-10-CM

## 2020-09-29 DIAGNOSIS — Z8759 Personal history of other complications of pregnancy, childbirth and the puerperium: Secondary | ICD-10-CM | POA: Insufficient documentation

## 2020-09-29 DIAGNOSIS — O09899 Supervision of other high risk pregnancies, unspecified trimester: Secondary | ICD-10-CM | POA: Insufficient documentation

## 2020-09-29 DIAGNOSIS — O0990 Supervision of high risk pregnancy, unspecified, unspecified trimester: Secondary | ICD-10-CM

## 2020-09-29 DIAGNOSIS — O34219 Maternal care for unspecified type scar from previous cesarean delivery: Secondary | ICD-10-CM | POA: Diagnosis not present

## 2020-09-29 DIAGNOSIS — O09523 Supervision of elderly multigravida, third trimester: Secondary | ICD-10-CM | POA: Insufficient documentation

## 2020-09-29 DIAGNOSIS — Z98891 History of uterine scar from previous surgery: Secondary | ICD-10-CM

## 2020-09-29 DIAGNOSIS — O365930 Maternal care for other known or suspected poor fetal growth, third trimester, not applicable or unspecified: Secondary | ICD-10-CM

## 2020-09-29 DIAGNOSIS — O09293 Supervision of pregnancy with other poor reproductive or obstetric history, third trimester: Secondary | ICD-10-CM

## 2020-09-29 DIAGNOSIS — Z3A33 33 weeks gestation of pregnancy: Secondary | ICD-10-CM

## 2020-09-30 ENCOUNTER — Other Ambulatory Visit: Payer: Self-pay | Admitting: *Deleted

## 2020-09-30 DIAGNOSIS — O09529 Supervision of elderly multigravida, unspecified trimester: Secondary | ICD-10-CM

## 2020-10-08 ENCOUNTER — Ambulatory Visit: Payer: PRIVATE HEALTH INSURANCE | Admitting: *Deleted

## 2020-10-08 ENCOUNTER — Ambulatory Visit: Payer: PRIVATE HEALTH INSURANCE | Attending: Obstetrics and Gynecology

## 2020-10-08 ENCOUNTER — Encounter: Payer: Self-pay | Admitting: *Deleted

## 2020-10-08 ENCOUNTER — Other Ambulatory Visit: Payer: Self-pay

## 2020-10-08 DIAGNOSIS — O43192 Other malformation of placenta, second trimester: Secondary | ICD-10-CM | POA: Insufficient documentation

## 2020-10-08 DIAGNOSIS — O09899 Supervision of other high risk pregnancies, unspecified trimester: Secondary | ICD-10-CM | POA: Insufficient documentation

## 2020-10-08 DIAGNOSIS — O365930 Maternal care for other known or suspected poor fetal growth, third trimester, not applicable or unspecified: Secondary | ICD-10-CM | POA: Diagnosis not present

## 2020-10-08 DIAGNOSIS — O09529 Supervision of elderly multigravida, unspecified trimester: Secondary | ICD-10-CM | POA: Diagnosis present

## 2020-10-08 DIAGNOSIS — Z8759 Personal history of other complications of pregnancy, childbirth and the puerperium: Secondary | ICD-10-CM

## 2020-10-08 DIAGNOSIS — O0990 Supervision of high risk pregnancy, unspecified, unspecified trimester: Secondary | ICD-10-CM | POA: Insufficient documentation

## 2020-10-08 DIAGNOSIS — O34219 Maternal care for unspecified type scar from previous cesarean delivery: Secondary | ICD-10-CM | POA: Diagnosis not present

## 2020-10-08 DIAGNOSIS — O09523 Supervision of elderly multigravida, third trimester: Secondary | ICD-10-CM | POA: Diagnosis not present

## 2020-10-08 DIAGNOSIS — Z98891 History of uterine scar from previous surgery: Secondary | ICD-10-CM | POA: Diagnosis present

## 2020-10-08 DIAGNOSIS — O09213 Supervision of pregnancy with history of pre-term labor, third trimester: Secondary | ICD-10-CM

## 2020-10-08 DIAGNOSIS — Z3A34 34 weeks gestation of pregnancy: Secondary | ICD-10-CM

## 2020-10-12 ENCOUNTER — Ambulatory Visit (INDEPENDENT_AMBULATORY_CARE_PROVIDER_SITE_OTHER): Payer: PRIVATE HEALTH INSURANCE

## 2020-10-12 ENCOUNTER — Other Ambulatory Visit: Payer: Self-pay

## 2020-10-12 VITALS — BP 100/58 | HR 79 | Wt 150.6 lb

## 2020-10-12 DIAGNOSIS — Z98891 History of uterine scar from previous surgery: Secondary | ICD-10-CM

## 2020-10-12 DIAGNOSIS — Z3A35 35 weeks gestation of pregnancy: Secondary | ICD-10-CM

## 2020-10-12 DIAGNOSIS — O0990 Supervision of high risk pregnancy, unspecified, unspecified trimester: Secondary | ICD-10-CM

## 2020-10-12 NOTE — Progress Notes (Signed)
   HIGH-RISK PREGNANCY OFFICE VISIT  Patient name: Kristen Phillips MRN [id number]  Date of birth: 1986/06/25 Chief Complaint:   Routine Prenatal Visit  Subjective:   Kristen Phillips is a 34 y.o. [id number] female at [redacted]w[redacted]d with an Estimated Date of Delivery: 11/14/20 being seen today for ongoing management of a high-risk pregnancy aeb has Previous cesarean section complicating pregnancy, antepartum condition or complication; Hyperemesis complicating pregnancy, antepartum; Supervision of high risk pregnancy, antepartum; Constipation; Short interval between pregnancies affecting pregnancy, antepartum; AMA (advanced maternal age) multigravida 35+; History of prior pregnancy with SGA newborn; History of VBAC; Previous preterm delivery, antepartum, first trimester; and Marginal insertion of umbilical cord affecting management of mother in second trimester on their problem list.  Patient presents today with complaint, questions, or concerns. Patient endorses fetal movement and occasional contractions.  Patient denies vaginal concerns including abnormal discharge, leaking of fluid, and bleeding.  Contractions: Irritability. Vag. Bleeding: None.  Movement: Present.  Reviewed past medical,surgical, social, obstetrical and family history as well as problem list, medications and allergies.  Objective   Vitals:   10/12/20 1101  BP: (!) 100/58  Pulse: 79  Weight: 150 lb 9.6 oz (68.3 kg)  Body mass index is 27.55 kg/m.  Total Weight Gain:-2 lb 6.4 oz (-1.089 kg)         Physical Examination:   General appearance: Well appearing, and in no distress  Mental status: Alert, oriented to person, place, and time  Skin: Warm & dry  Cardiovascular: Normal heart rate noted  Respiratory: Normal respiratory effort, no distress  Abdomen: Soft, gravid, nontender, AGA with Fundal height of Fundal Height: 31 cm  Pelvic: Cervical exam deferred           Extremities: Edema: None  Fetal Status: Fetal Heart  Rate (bpm): 138  Movement: Present   No results found for this or any previous visit (from the past 24 hour(s)).  Assessment & Plan:  High-risk pregnancy of a 34 y.o., [id number] at [redacted]w[redacted]d with an Estimated Date of Delivery: 11/14/20   1. Supervision of high risk pregnancy, antepartum -Reviewed [place] from 10/21 with infant IUGR at 7%ile. -Plan for repeat on 10/27 with planning for delivery. -Informed that delivery will likely occur at 37 weeks unless significant change in fetal well-being. -Patient encouraged to plan. -Will collect GBS at next visit.   2. History of VBAC -Patient desires VBAC and last delivery was VBAC. -Consents signed at 10/7 visit.  3. [redacted] weeks gestation of pregnancy -Doing well. -GBS at next visit. -Expecting female child for circumcision.  Planning no birth control after delivery.    Meds: No orders of the defined types were placed in this encounter.  Labs/procedures today:  Lab Orders  No laboratory test(s) ordered today     Reviewed: Preterm labor symptoms and general obstetric precautions including but not limited to vaginal bleeding, contractions, leaking of fluid and fetal movement were reviewed in detail with the patient.  All questions were answered.  Follow-up: Return in about 1 week (around 10/19/2020) for HROB.  No orders of the defined types were placed in this encounter.  Cherre Robins MSN, CNM 10/12/2020

## 2020-10-12 NOTE — Patient Instructions (Signed)

## 2020-10-13 ENCOUNTER — Encounter: Payer: Self-pay | Admitting: General Practice

## 2020-10-14 ENCOUNTER — Ambulatory Visit: Payer: PRIVATE HEALTH INSURANCE | Attending: Obstetrics and Gynecology

## 2020-10-14 ENCOUNTER — Ambulatory Visit: Payer: PRIVATE HEALTH INSURANCE | Admitting: *Deleted

## 2020-10-14 ENCOUNTER — Other Ambulatory Visit: Payer: Self-pay | Admitting: Obstetrics and Gynecology

## 2020-10-14 ENCOUNTER — Ambulatory Visit: Payer: PRIVATE HEALTH INSURANCE

## 2020-10-14 ENCOUNTER — Other Ambulatory Visit: Payer: Self-pay

## 2020-10-14 ENCOUNTER — Encounter: Payer: Self-pay | Admitting: *Deleted

## 2020-10-14 ENCOUNTER — Other Ambulatory Visit: Payer: PRIVATE HEALTH INSURANCE

## 2020-10-14 DIAGNOSIS — Z3A35 35 weeks gestation of pregnancy: Secondary | ICD-10-CM

## 2020-10-14 DIAGNOSIS — O34219 Maternal care for unspecified type scar from previous cesarean delivery: Secondary | ICD-10-CM

## 2020-10-14 DIAGNOSIS — O09213 Supervision of pregnancy with history of pre-term labor, third trimester: Secondary | ICD-10-CM | POA: Diagnosis not present

## 2020-10-14 DIAGNOSIS — O09529 Supervision of elderly multigravida, unspecified trimester: Secondary | ICD-10-CM

## 2020-10-14 DIAGNOSIS — Z8759 Personal history of other complications of pregnancy, childbirth and the puerperium: Secondary | ICD-10-CM | POA: Insufficient documentation

## 2020-10-14 DIAGNOSIS — O365990 Maternal care for other known or suspected poor fetal growth, unspecified trimester, not applicable or unspecified: Secondary | ICD-10-CM | POA: Insufficient documentation

## 2020-10-14 DIAGNOSIS — O365930 Maternal care for other known or suspected poor fetal growth, third trimester, not applicable or unspecified: Secondary | ICD-10-CM

## 2020-10-14 DIAGNOSIS — O43192 Other malformation of placenta, second trimester: Secondary | ICD-10-CM

## 2020-10-14 DIAGNOSIS — O365921 Maternal care for other known or suspected poor fetal growth, second trimester, fetus 1: Secondary | ICD-10-CM

## 2020-10-14 DIAGNOSIS — O0990 Supervision of high risk pregnancy, unspecified, unspecified trimester: Secondary | ICD-10-CM | POA: Diagnosis present

## 2020-10-14 DIAGNOSIS — Z98891 History of uterine scar from previous surgery: Secondary | ICD-10-CM | POA: Diagnosis present

## 2020-10-14 DIAGNOSIS — O09523 Supervision of elderly multigravida, third trimester: Secondary | ICD-10-CM

## 2020-10-14 DIAGNOSIS — O09899 Supervision of other high risk pregnancies, unspecified trimester: Secondary | ICD-10-CM | POA: Diagnosis present

## 2020-10-14 DIAGNOSIS — O09293 Supervision of pregnancy with other poor reproductive or obstetric history, third trimester: Secondary | ICD-10-CM | POA: Diagnosis not present

## 2020-10-14 NOTE — Procedures (Signed)
Joeline A Schild Jun 29, 1986 [redacted]w[redacted]d  Fetus A Non-Stress Test Interpretation for 10/14/20  Indication: IUGR  Fetal Heart Rate A Mode: External Baseline Rate (A): 125 bpm Variability: Moderate Accelerations: 15 x 15 Decelerations: None Multiple birth?: No  Uterine Activity Mode: Palpation, Toco Contraction Frequency (min): 1-2 with U/I Contraction Duration (sec): 20-90 Contraction Quality: Mild Resting Tone Palpated: Relaxed Resting Time: Adequate  Interpretation (Fetal Testing) Nonstress Test Interpretation: Reactive Comments: Reviewed tracing with Dr. Judeth Cornfield

## 2020-10-19 ENCOUNTER — Encounter: Payer: Self-pay | Admitting: *Deleted

## 2020-10-19 ENCOUNTER — Other Ambulatory Visit (HOSPITAL_COMMUNITY)
Admission: RE | Admit: 2020-10-19 | Discharge: 2020-10-19 | Disposition: A | Discharge status: Home / Self Care | Payer: PRIVATE HEALTH INSURANCE | Source: Ambulatory Visit

## 2020-10-19 ENCOUNTER — Other Ambulatory Visit: Payer: Self-pay

## 2020-10-19 ENCOUNTER — Telehealth (HOSPITAL_COMMUNITY): Payer: Self-pay | Admitting: *Deleted

## 2020-10-19 ENCOUNTER — Ambulatory Visit (INDEPENDENT_AMBULATORY_CARE_PROVIDER_SITE_OTHER): Payer: Medicaid Other

## 2020-10-19 ENCOUNTER — Encounter (HOSPITAL_COMMUNITY): Payer: Self-pay | Admitting: *Deleted

## 2020-10-19 VITALS — BP 107/70 | HR 80 | Wt 153.0 lb

## 2020-10-19 DIAGNOSIS — Z3A36 36 weeks gestation of pregnancy: Secondary | ICD-10-CM

## 2020-10-19 DIAGNOSIS — O0990 Supervision of high risk pregnancy, unspecified, unspecified trimester: Secondary | ICD-10-CM | POA: Insufficient documentation

## 2020-10-19 DIAGNOSIS — Z98891 History of uterine scar from previous surgery: Secondary | ICD-10-CM

## 2020-10-19 DIAGNOSIS — O365990 Maternal care for other known or suspected poor fetal growth, unspecified trimester, not applicable or unspecified: Secondary | ICD-10-CM

## 2020-10-19 NOTE — Patient Instructions (Signed)

## 2020-10-19 NOTE — Telephone Encounter (Signed)
Preadmission screen  

## 2020-10-19 NOTE — Progress Notes (Addendum)
Subjective:  Kristen Phillips is a 34 y.o. [id number] at [redacted]w[redacted]d being seen today for her first prenatal visit.  She is currently monitored for the following issues for this high-risk pregnancy and has Previous cesarean section complicating pregnancy, antepartum condition or complication; Hyperemesis complicating pregnancy, antepartum; Supervision of high risk pregnancy, antepartum; Constipation; Short interval between pregnancies affecting pregnancy, antepartum; AMA (advanced maternal age) multigravida 35+; History of prior pregnancy with SGA newborn; History of VBAC; Previous preterm delivery, antepartum, first trimester; and Marginal insertion of umbilical cord affecting management of mother in second trimester on their problem list.  Patient reports contractions since Thursday of last week (10/15/2020).  Contractions: Irritability. Vag. Bleeding: None.  Movement: Present. Denies leaking of fluid.   Patient states she is here to discuss getting induced.   Patient endorse seeing spots when she walks around but not anything regularly.   Patient reports DOE, nausea and vomiting that has increased in the past couple weeks.   The following portions of the patient's history were reviewed and updated as appropriate: allergies, current medications, past family history, past medical history, past social history, past surgical history and problem list. Problem list updated.  Objective:   Vitals:   10/19/20 1047  BP: 107/70  Pulse: 80  Weight: 153 lb (69.4 kg)    Fetal Status: Fetal Heart Rate (bpm): 126   Movement: Present     Physical Exam Constitutional:      General: She is not in acute distress.    Appearance: Normal appearance.  Genitourinary:     Pelvic exam was performed with patient supine.     Vulva normal.     Vaginal discharge present.     Genitourinary Comments: Cervix dilated to 2cm on manual exam   HENT:     Head: Normocephalic.  Cardiovascular:     Rate and Rhythm: Normal  rate and regular rhythm.     Pulses: Normal pulses.     Heart sounds: Normal heart sounds. No murmur heard.  No friction rub. No gallop.   Pulmonary:     Effort: Pulmonary effort is normal.     Breath sounds: Normal breath sounds.  Abdominal:     Comments: Fundal height measured to be 33cm   Neurological:     General: No focal deficit present.     Mental Status: She is alert and oriented to person, place, and time. Mental status is at baseline.  Psychiatric:        Mood and Affect: Mood normal.        Behavior: Behavior normal.        Thought Content: Thought content normal.        Judgment: Judgment normal.  Vitals reviewed.      Assessment and Plan:  Pregnancy: [id number] at [redacted]w[redacted]d  1. Supervision of high risk pregnancy, antepartum  - Culture, beta strep (group b only) obtained  - GC/Chlamydia probe amp ([place])not at [facility]  Preterm labor symptoms and general obstetric precautions including but not limited to vaginal bleeding, contractions, leaking of fluid and fetal movement were reviewed in detail with the patient.  Patient is scheduled for induction on Friday 10/23/2020.    Please refer to After Visit Summary for other counseling recommendations.  Return in about 4 weeks (around 11/16/2020) for Postpartum Virtual.   Earl Gala, Student-PA  Earl Gala  10/19/2020 11:35 AM

## 2020-10-20 ENCOUNTER — Encounter (HOSPITAL_COMMUNITY): Payer: Self-pay | Admitting: Family Medicine

## 2020-10-20 ENCOUNTER — Inpatient Hospital Stay (HOSPITAL_COMMUNITY)
Admission: AD | Admit: 2020-10-20 | Discharge: 2020-10-22 | DRG: 807 | Disposition: A | Discharge status: Home / Self Care | Payer: Medicaid Other | Attending: Family Medicine | Admitting: Family Medicine

## 2020-10-20 ENCOUNTER — Other Ambulatory Visit: Payer: Self-pay | Admitting: Advanced Practice Midwife

## 2020-10-20 ENCOUNTER — Other Ambulatory Visit: Payer: Self-pay

## 2020-10-20 DIAGNOSIS — Z87891 Personal history of nicotine dependence: Secondary | ICD-10-CM | POA: Diagnosis not present

## 2020-10-20 DIAGNOSIS — O34211 Maternal care for low transverse scar from previous cesarean delivery: Secondary | ICD-10-CM | POA: Diagnosis present

## 2020-10-20 DIAGNOSIS — O43123 Velamentous insertion of umbilical cord, third trimester: Secondary | ICD-10-CM | POA: Diagnosis present

## 2020-10-20 DIAGNOSIS — O34219 Maternal care for unspecified type scar from previous cesarean delivery: Secondary | ICD-10-CM | POA: Diagnosis present

## 2020-10-20 DIAGNOSIS — Z20822 Contact with and (suspected) exposure to covid-19: Secondary | ICD-10-CM | POA: Diagnosis present

## 2020-10-20 DIAGNOSIS — O09211 Supervision of pregnancy with history of pre-term labor, first trimester: Secondary | ICD-10-CM

## 2020-10-20 DIAGNOSIS — O365930 Maternal care for other known or suspected poor fetal growth, third trimester, not applicable or unspecified: Secondary | ICD-10-CM | POA: Diagnosis present

## 2020-10-20 DIAGNOSIS — Z880 Allergy status to penicillin: Secondary | ICD-10-CM | POA: Diagnosis not present

## 2020-10-20 DIAGNOSIS — O09529 Supervision of elderly multigravida, unspecified trimester: Secondary | ICD-10-CM

## 2020-10-20 DIAGNOSIS — O09899 Supervision of other high risk pregnancies, unspecified trimester: Secondary | ICD-10-CM

## 2020-10-20 DIAGNOSIS — O99824 Streptococcus B carrier state complicating childbirth: Secondary | ICD-10-CM | POA: Diagnosis present

## 2020-10-20 DIAGNOSIS — Z3A36 36 weeks gestation of pregnancy: Secondary | ICD-10-CM

## 2020-10-20 DIAGNOSIS — O43192 Other malformation of placenta, second trimester: Secondary | ICD-10-CM | POA: Diagnosis present

## 2020-10-20 LAB — RESPIRATORY PANEL BY RT PCR (FLU A&B, COVID)
Influenza A by PCR: NEGATIVE
Influenza B by PCR: NEGATIVE
SARS Coronavirus 2 by RT PCR: NEGATIVE

## 2020-10-20 LAB — CBC
HCT: 33 % — ABNORMAL LOW (ref 36.0–46.0)
Hemoglobin: 10.8 g/dL — ABNORMAL LOW (ref 12.0–15.0)
MCH: 32.4 pg (ref 26.0–34.0)
MCHC: 32.7 g/dL (ref 30.0–36.0)
MCV: 99.1 fL (ref 80.0–100.0)
Platelets: 284 10*3/uL (ref 150–400)
RBC: 3.33 MIL/uL — ABNORMAL LOW (ref 3.87–5.11)
RDW: 13 % (ref 11.5–15.5)
WBC: 10.4 10*3/uL (ref 4.0–10.5)
nRBC: 0 % (ref 0.0–0.2)

## 2020-10-20 LAB — TYPE AND SCREEN
ABO/RH(D): O POS
Antibody Screen: NEGATIVE

## 2020-10-20 LAB — GC/CHLAMYDIA PROBE AMP (~~LOC~~) NOT AT ARMC
Chlamydia: NEGATIVE
Comment: NEGATIVE
Comment: NORMAL
Neisseria Gonorrhea: NEGATIVE

## 2020-10-20 LAB — POCT FERN TEST: POCT Fern Test: POSITIVE

## 2020-10-20 MED ORDER — BENZOCAINE-MENTHOL 20-0.5 % EX AERO
1.0000 "application " | INHALATION_SPRAY | CUTANEOUS | Status: DC | PRN
  Filled 2020-10-20: qty 56

## 2020-10-20 MED ORDER — SENNOSIDES-DOCUSATE SODIUM 8.6-50 MG PO TABS
2.0000 | ORAL_TABLET | ORAL | Status: DC
  Administered 2020-10-21 – 2020-10-22 (×2): 2 via ORAL
  Filled 2020-10-20 (×2): qty 2

## 2020-10-20 MED ORDER — DIBUCAINE (PERIANAL) 1 % EX OINT: 1.0000 "application " | TOPICAL_OINTMENT | CUTANEOUS | Status: DC | PRN

## 2020-10-20 MED ORDER — OXYTOCIN BOLUS FROM INFUSION
333.0000 mL | Freq: Once | INTRAVENOUS | Status: AC
  Administered 2020-10-20: 333 mL via INTRAVENOUS

## 2020-10-20 MED ORDER — ZOLPIDEM TARTRATE 5 MG PO TABS: 5.0000 mg | ORAL_TABLET | Freq: Every evening | ORAL | Status: DC | PRN

## 2020-10-20 MED ORDER — COCONUT OIL OIL: 1.0000 "application " | TOPICAL_OIL | Status: DC | PRN

## 2020-10-20 MED ORDER — LACTATED RINGERS IV SOLN: INTRAVENOUS | Status: DC

## 2020-10-20 MED ORDER — OXYTOCIN-SODIUM CHLORIDE 30-0.9 UT/500ML-% IV SOLN
1.0000 m[IU]/min | INTRAVENOUS | Status: DC
  Filled 2020-10-20: qty 500

## 2020-10-20 MED ORDER — SODIUM CHLORIDE 0.9 % IV SOLN: 5.0000 10*6.[IU] | Freq: Once | INTRAVENOUS | Status: DC

## 2020-10-20 MED ORDER — WITCH HAZEL-GLYCERIN EX PADS: 1.0000 "application " | MEDICATED_PAD | CUTANEOUS | Status: DC | PRN

## 2020-10-20 MED ORDER — TERBUTALINE SULFATE 1 MG/ML IJ SOLN
0.2500 mg | Freq: Once | INTRAMUSCULAR | Status: DC | PRN
  Filled 2020-10-20: qty 1

## 2020-10-20 MED ORDER — FENTANYL CITRATE (PF) 100 MCG/2ML IJ SOLN: 50.0000 ug | INTRAMUSCULAR | Status: DC | PRN

## 2020-10-20 MED ORDER — IBUPROFEN 600 MG PO TABS
600.0000 mg | ORAL_TABLET | Freq: Four times a day (QID) | ORAL | Status: DC
  Administered 2020-10-20 – 2020-10-22 (×7): 600 mg via ORAL
  Filled 2020-10-20 (×7): qty 1

## 2020-10-20 MED ORDER — ACETAMINOPHEN 325 MG PO TABS: 650.0000 mg | ORAL_TABLET | ORAL | Status: DC | PRN

## 2020-10-20 MED ORDER — ACETAMINOPHEN 325 MG PO TABS
650.0000 mg | ORAL_TABLET | ORAL | Status: DC | PRN
  Administered 2020-10-21: 650 mg via ORAL
  Filled 2020-10-20: qty 2

## 2020-10-20 MED ORDER — LIDOCAINE HCL (PF) 1 % IJ SOLN
30.0000 mL | INTRAMUSCULAR | Status: DC | PRN
  Filled 2020-10-20: qty 30

## 2020-10-20 MED ORDER — PENICILLIN G POT IN DEXTROSE 60000 UNIT/ML IV SOLN: 3.0000 10*6.[IU] | INTRAVENOUS | Status: DC

## 2020-10-20 MED ORDER — OXYTOCIN-SODIUM CHLORIDE 30-0.9 UT/500ML-% IV SOLN
2.5000 [IU]/h | INTRAVENOUS | Status: DC
  Administered 2020-10-20: 2.5 [IU]/h via INTRAVENOUS

## 2020-10-20 MED ORDER — ONDANSETRON HCL 4 MG/2ML IJ SOLN
4.0000 mg | Freq: Four times a day (QID) | INTRAMUSCULAR | Status: DC | PRN
  Filled 2020-10-20: qty 2

## 2020-10-20 MED ORDER — SOD CITRATE-CITRIC ACID 500-334 MG/5ML PO SOLN: 30.0000 mL | ORAL | Status: DC | PRN

## 2020-10-20 MED ORDER — ONDANSETRON HCL 4 MG/2ML IJ SOLN
4.0000 mg | INTRAMUSCULAR | Status: DC | PRN
  Administered 2020-10-20: 4 mg via INTRAVENOUS

## 2020-10-20 MED ORDER — ONDANSETRON HCL 4 MG PO TABS: 4.0000 mg | ORAL_TABLET | ORAL | Status: DC | PRN

## 2020-10-20 MED ORDER — DIPHENHYDRAMINE HCL 25 MG PO CAPS: 25.0000 mg | ORAL_CAPSULE | Freq: Four times a day (QID) | ORAL | Status: DC | PRN

## 2020-10-20 MED ORDER — PRENATAL MULTIVITAMIN CH
1.0000 | ORAL_TABLET | Freq: Every day | ORAL | Status: DC
  Administered 2020-10-21 – 2020-10-22 (×2): 1 via ORAL
  Filled 2020-10-20 (×2): qty 1

## 2020-10-20 MED ORDER — LACTATED RINGERS IV SOLN: 500.0000 mL | INTRAVENOUS | Status: DC | PRN

## 2020-10-20 MED ORDER — SIMETHICONE 80 MG PO CHEW: 80.0000 mg | CHEWABLE_TABLET | ORAL | Status: DC | PRN

## 2020-10-20 MED ORDER — TETANUS-DIPHTH-ACELL PERTUSSIS 5-2.5-18.5 LF-MCG/0.5 IM SUSY: 0.5000 mL | PREFILLED_SYRINGE | Freq: Once | INTRAMUSCULAR | Status: DC

## 2020-10-20 NOTE — H&P (Signed)
Kristen Phillips is a 34 y.o. female, [id number] at 36.3 weeks, presenting for contractions. Patient receives care at [facility] and was supervised for a high risk pregnancy. Pregnancy and medical history significant for problems as listed below. She is GBS unknown and expresses a desire for epidural for pain management.  She is anticipating a female infant and requests no PP birth control method.  Patient arrived to MAU at 2cm and rapidly progressed to complete dilation.  Patient delivered precipitously prior to transfer to L&D.   VBAC Consent on file and signed 09/24/2020  Patient Active Problem List   Diagnosis Date Noted  . Indication for care or intervention in labor or delivery 10/20/2020  . Marginal insertion of umbilical cord affecting management of mother in second trimester 06/24/2020  . Previous preterm delivery, antepartum, first trimester 05/01/2020  . History of prior pregnancy with SGA newborn 04/30/2020  . History of VBAC 04/30/2020  . Supervision of high risk pregnancy, antepartum 04/23/2020  . Constipation   . Short interval between pregnancies affecting pregnancy, antepartum   . AMA (advanced maternal age) multigravida 35+   . Hyperemesis complicating pregnancy, antepartum 04/13/2020  . Previous cesarean section complicating pregnancy, antepartum condition or complication 02/27/2014    History of present pregnancy:  Last evaluation: 10/19/2020 in office with J.Leighana Neyman, CNM   Vitals: 107/70, 80, 153lbs, VE: ~2cm    Nursing Staff Provider  Office Location [facility] Dating  LMP consistent with first trimester [place]  Language  English Anatomy US  Normal - marginal cord insertion - incomplete anatomy - FU [place] scheduled 07/21/20  Flu Vaccine  Declined 09/24/20 Genetic Screen  NIPS: low risk female   AFP:      TDaP vaccine  Declined 09/24/20 Hgb A1C or  GTT Early  Third trimester   Rhogam  N/A   LAB RESULTS     Blood Type O/Positive/-- (05/14 1125)   Feeding Plan Both Antibody Negative  (05/14 1125)  Contraception none Rubella 2.92 (05/14 1125)  Circumcision Yes, BOY  RPR Non Reactive (05/14 1125)   Pediatrician  [facility] HBsAg Negative (05/14 1125)   Support Person  Lloyd Huger (FOB) HCVAb Negative  Prenatal Classes  HIV Non Reactive (05/14 1125)     BTL Consent NA GBS  (For PCN allergy, check sensitivities)   VBAC Consent needs Pap Normal 2020 @ [facility]    Hgb Electro    BP Cuff has own cuff CF     SMA     Waterbirth  [ ]  Class [ ]  Consent [ ]  CNM visit    Induction  [ ]  Orders Entered [ ] Foley Y/N     OB History    Gravida  6   Para  2   Term  1   Preterm  1   AB  3   Living  2     SAB  1   TAB  2   Ectopic  0   Multiple  0   Live Births  2             Past Medical History:  Diagnosis Date  . Constipation   . Headache(784.0)   . Hyperemesis    Past Surgical History:  Procedure Laterality Date  . CESAREAN SECTION    . INDUCED ABORTION     Family History: family history includes Diabetes in her maternal grandmother, mother, and paternal grandmother. Social History:  reports that she quit smoking about 7 years ago. Her smoking use included  cigarettes. She has a 3.00 pack-year smoking history. She has never used smokeless tobacco. She reports that she does not drink alcohol and does not use drugs.   Prenatal Transfer Tool  Maternal Diabetes: No Genetic Screening: Normal Maternal Ultrasounds/Referrals: IUGR Fetal Ultrasounds or other Referrals:  Referred to Materal Fetal Medicine  Maternal Substance Abuse:  No Significant Maternal Medications:  None Significant Maternal Lab Results: Other: GBS Positive   Maternal Assessment:  ROS: +Contractions, +LOF, -Vaginal Bleeding, +Fetal Movement  All other systems reviewed and negative.    No Known Allergies   Dilation: 2 Effacement (%): 70 Station: -2 Exam by:: Leafy Ro, RN Blood pressure 128/77, pulse 95, temperature 97.8 F (36.6 C), temperature source Oral,  resp. rate 12, last menstrual period 02/08/2020, SpO2 100 %, unknown if currently breastfeeding.  Physical Exam Constitutional:      General: She is in acute distress.     Appearance: Normal appearance.  HENT:     Head: Normocephalic and atraumatic.  Eyes:     Conjunctiva/sclera: Conjunctivae normal.  Cardiovascular:     Rate and Rhythm: Normal rate.  Pulmonary:     Effort: Pulmonary effort is normal.  Musculoskeletal:     Cervical back: Normal range of motion.  Skin:    General: Skin is warm and dry.  Neurological:     Mental Status: She is alert and oriented to person, place, and time.  Psychiatric:        Mood and Affect: Mood normal.        Behavior: Behavior normal.        Thought Content: Thought content normal.     Fetal Assessment: [place]: -Pelvis: Proven to 2045g -EFW: 1979 grams by [place] on 10/27 -Presentation: Vertex  FHR: 125 by doppler    Assessment IUP at 36.3weeks GBS Pending IUGR H/O C/S with Successful VBAC MCI   Plan: -Admission orders released from [facility]. -Provider to bedside for delivery. -VBAC of infant female without incident-see delivery note. -No Prophylaxis received. -Postpartum orders placed.   Joellyn Quails, MSN 10/20/2020, 8:37 PM

## 2020-10-20 NOTE — Discharge Summary (Signed)
Postpartum Discharge Summary  Patient Name: Kristen Phillips DOB: Oct 14, 1986 MRN: [id number]  Date of admission: 10/20/2020 Delivery date:10/20/2020  Delivering provider: Gavin Pound  Date of discharge: 10/22/2020  Admitting diagnosis: Indication for care or intervention in labor or delivery [O75.9] Intrauterine pregnancy: [redacted]w[redacted]d    Secondary diagnosis:  Principal Problem:   Vaginal delivery Active Problems:   Previous cesarean section complicating pregnancy, antepartum condition or complication   Short interval between pregnancies affecting pregnancy, antepartum   AMA (advanced maternal age) multigravida 35+   Previous preterm delivery, antepartum, first trimester   Marginal insertion of umbilical cord affecting management of mother in second trimester   Indication for care or intervention in labor or delivery  Additional problems: as noted above   Discharge diagnosis: Preterm Pregnancy Delivered, VBAC                                            Post partum procedures:N/A Augmentation: N/A Complications: None  Hospital course: Onset of Labor With Vaginal Delivery      34y.o. yo [id number] admitted in Latent Labor on 10/20/2020. Patient had an uncomplicated labor course as follows:  Membrane Rupture Time/Date: 7:20 PM ,10/20/2020   Delivery Method:Vaginal, Spontaneous  Episiotomy: None  Lacerations:  Labial  Patient had an uncomplicated postpartum course.  She is ambulating, tolerating a regular diet, passing flatus, and urinating well. Patient is discharged home in stable condition on 10/22/20.  Newborn Data: Birth date:10/20/2020  Birth time:7:56 PM  Gender:Female  Living status:Living  Apgars:8 ,9  Weight:2295 g   Magnesium Sulfate received: No BMZ received: No Rhophylac:N/A MMR:N/A T-DaP:N/A Flu: N/A Transfusion:No  Physical exam  Vitals:   10/21/20 0615 10/21/20 1115 10/21/20 2215 10/22/20 0629  BP: 91/64 106/74 93/63 99/64   Pulse: (!) 59 67 69  61  Resp: 18 16  16   Temp: 98.4 F (36.9 C) 99 F (37.2 C) 98.2 F (36.8 C) 98.7 F (37.1 C)  TempSrc: Axillary Axillary Oral Oral  SpO2:  100% 100% 99%   General: alert, cooperative and no distress Lochia: appropriate Uterine Fundus: firm Incision: N/A DVT Evaluation: No evidence of DVT seen on physical exam. No cords or calf tenderness. No significant calf/ankle edema. Labs: Lab Results  Component Value Date   WBC 10.4 10/20/2020   HGB 10.8 (L) 10/20/2020   HCT 33.0 (L) 10/20/2020   MCV 99.1 10/20/2020   PLT 284 10/20/2020   CMP Latest Ref Rng & Units 04/16/2020  Glucose 70 - 99 mg/dL 79  BUN 6 - 20 mg/dL <5(L)  Creatinine 0.44 - 1.00 mg/dL 0.62  Sodium 135 - 145 mmol/L 139  Potassium 3.5 - 5.1 mmol/L 3.0(L)  Chloride 98 - 111 mmol/L 113(H)  CO2 22 - 32 mmol/L 16(L)  Calcium 8.9 - 10.3 mg/dL 8.2(L)  Total Protein 6.5 - 8.1 g/dL 4.9(L)  Total Bilirubin 0.3 - 1.2 mg/dL 1.6(H)  Alkaline Phos 38 - 126 U/L 42  AST 15 - 41 U/L 72(H)  ALT 0 - 44 U/L 113(H)   EdFlavia Shippercore: Edinburgh Postnatal Depression Scale Screening Tool 10/21/2020  I have been able to laugh and see the funny side of things. 0  I have looked forward with enjoyment to things. 0  I have blamed myself unnecessarily when things went wrong. 1  I have been anxious or worried for no good  reason. 0  I have felt scared or panicky for no good reason. 0  Things have been getting on top of me. 1  I have been so unhappy that I have had difficulty sleeping. 1  I have felt sad or miserable. 0  I have been so unhappy that I have been crying. 1  The thought of harming myself has occurred to me. 0  Edinburgh Postnatal Depression Scale Total 4     After visit meds:  Allergies as of 10/22/2020   No Known Allergies     Medication List    STOP taking these medications   ondansetron 4 MG disintegrating tablet Commonly known as: ZOFRAN-ODT     TAKE these medications   acetaminophen 325 MG tablet Commonly  known as: TYLENOL Take 2 tablets (650 mg total) by mouth every 6 (six) hours as needed (mild discomfortORtemperature>/= 100.4 F).   coconut oil Oil Apply 1 application topically as needed (nipple pain).   ibuprofen 600 MG tablet Commonly known as: ADVIL Take 1 tablet (600 mg total) by mouth every 6 (six) hours.   prenatal multivitamin Tabs tablet Take 1 tablet by mouth daily at 12 noon.        Discharge home in stable condition Infant Feeding: breast and bottle Infant Disposition:home with mother Discharge instruction: per After Visit Summary and Postpartum booklet. Activity: Advance as tolerated. Pelvic rest for 6 weeks.  Diet: routine diet Future Appointments: Future Appointments  Date Time Provider [place]  10/27/2020 10:35 AM Tresea Mall, CNM [facility]  11/03/2020 10:35 AM Tresea Mall, CNM [facility]  11/10/2020 10:35 AM Tresea Mall, CNM [facility]  11/17/2020 10:35 AM Berniece Andreas, Placido Sou, MD [facility]   Follow up Visit: (message sent to office 10/20/2020)  Please schedule this patient for a Virtual postpartum visit in 4 weeks with the following provider: APP. Additional Postpartum F/U: N/A High risk pregnancy complicated by: AMA, IUGR Delivery mode:  Vaginal, Spontaneous  Anticipated Birth Control:  Declines   10/22/2020 Randa Ngo, MD

## 2020-10-20 NOTE — Discharge Instructions (Signed)

## 2020-10-20 NOTE — MAU Note (Addendum)
Pt reports ctx's since Thursday.  Pt reports ctx's every 10 minutes today.  Denies vaginal bleeding Denies LOF.  Reports +FM

## 2020-10-21 ENCOUNTER — Ambulatory Visit: Payer: PRIVATE HEALTH INSURANCE

## 2020-10-21 LAB — RPR: RPR Ser Ql: NONREACTIVE

## 2020-10-21 NOTE — Clinical Social Work Maternal (Signed)
CLINICAL SOCIAL WORK MATERNAL/CHILD NOTE  Patient Details  Name: Kristen Phillips MRN: [id number] Date of Birth: 10/17/1986  Date:  10/21/2020  Clinical Social Worker Initiating Note:  Snigdha Howser, LCSWA Date/Time: Initiated:  10/21/20/0855     Child's Name:  Oshun Neal   Biological Parents:  Mother   Need for Interpreter:  None   Reason for Referral:  Current Substance Use/Substance Use During Pregnancy    Address:  [address] [place] [address]    Phone number:  [phone] (home)     Additional phone number:   Household Members/Support Persons (HM/SP):   Household Member/Support Person 1, Household Member/Support Person 2   HM/SP Name Relationship DOB or Age  HM/SP -1 Camiera Harris Daughter 04/15/2004  HM/SP -2 Shavonna Williamson Daughter 02/28/2014  HM/SP -3        HM/SP -4        HM/SP -5        HM/SP -6        HM/SP -7        HM/SP -8          Natural Supports (not living in the home):  Immediate Family   Professional Supports: None   Employment: Full-time   Type of Work: [place]   Education:  Some College   Homebound arranged:    Financial Resources:  Medicaid   Other Resources:  Food Stamps    Cultural/Religious Considerations Which May Impact Care:    Strengths:  Home prepared for child    Psychotropic Medications:         Pediatrician:       Pediatrician List:   [place]    [place]    [place]    [place]    [place]    [place]      Pediatrician Fax Number:    Risk Factors/Current Problems:  Substance Use    Cognitive State:  Linear Thinking , Able to Concentrate    Mood/Affect:  Calm , Interested    CSW Assessment: CSW consulted for THC use & baby UDS positive for THC. CSW met with MOB to offer support and complete assessment. CSW observed MOB in room alone, holding sleeping baby. CSW introduced self and role. CSW informed MOB of reason for consult, MOB expressed  understanding. MOB resides alone with two other children. MOB is employed full time and receives food stamps. MOB has the information for WIC if she is interested in applying. MOB denies any mental health history. MOB denies any current SI, HI or being involved in any DV. MOB identifies her sister as a support and expressed she is currently feeling okay.   MOB confirmed THC use during pregnancy. MOB stated her last use was 2-3 days ago to help with nausea. MOB denies any other prescription or substance use. CSW infomred MOB that her UDS tested postiive for THC during a MAU visit. CSW informed MOB of hospital drug screen policy. CSW informed MOB an UDS and CDS is performed on baby. CSW informed MOB that baby's UDS tested positive for THC and that a CPS report is required. MOB expressed understanding and denies any current or previous CPS history.   CSW provided education regarding the baby blues period vs. perinatal mood disorders, discussed treatment and gave resources for mental health follow up if concerns arise.  CSW recommends self-evaluation during the postpartum time period using the New Mom Checklist from Postpartum Progress and encouraged MOB to contact a medical   professional if symptoms are noted at any time.    CSW provided review of Sudden Infant Death Syndrome (SIDS) precautions. MOB stated baby will sleep in a crib once discharged home. MOB has all of the essential needs for baby, including a new carseat. MOB is still in the process of choosing a pediatrician. MOB declined any additional referrals to community resources.     CSW made a CPS report with [place] DSS. CSW will continue to monitor CDS and make an additional CPS report if warranted. CSW identifies no further need for intervention and no barriers to discharge at this time.  CSW Plan/Description:  No Further Intervention Required/No Barriers to Discharge, Perinatal Mood and Anxiety Disorder (PMADs) Education, CSW Will Continue  to Monitor Umbilical Cord Tissue Drug Screen Results and Make Report if Warranted, Child Protective Service Report , Hospital Drug Screen Policy Information, Sudden Infant Death Syndrome (SIDS) Education, Other Information/Referral to Community Resources    Lennart Gladish J Brendia Dampier, LCSWA 10/21/2020, 9:30 AM 

## 2020-10-21 NOTE — Plan of Care (Signed)
  Problem: Life Cycle: Goal: Chance of risk for complications during the postpartum period will decrease Note: Discussed the importance of emptying bladder at least every 2-3 hours and prior to breast feeding in order to decrease pain from cramping. Patient states cramps are worse with breast feeding. Patient has heating packs for cramping as well. Kristen Phillips, [name]

## 2020-10-21 NOTE — Progress Notes (Addendum)
POSTPARTUM PROGRESS NOTE  PPD#1  Subjective: Kristen Phillips is a 34 y.o. [id number] s/p VBAC at [redacted]w[redacted]d.  She reports she doing well. No acute events overnight. She denies any problems with ambulating, voiding or po intake. Denies nausea or vomiting. She has passed flatus but reports no bowel movement yet. Pain is well controlled.  Lochia is normal.  Patient's only complaint is some uterine cramping with breastfeeding.   Objective: Blood pressure 91/64, pulse (!) 59, temperature 98.4 F (36.9 C), temperature source Axillary, resp. rate 18, last menstrual period 02/08/2020, SpO2 100 %, unknown if currently breastfeeding.  Physical Exam:  General: alert, cooperative and no distress Chest: no respiratory distress Abdomen: soft, non-tender  Uterine Fundus: firm and at level of umbilicus Extremities: No calf swelling or tenderness  no edema  Recent Labs    10/20/20 1933  HGB 10.8*  HCT 33.0*    Assessment/Plan: Kristen Phillips is a 34 y.o. [id number] s/p precipitous VBAC at [redacted]w[redacted]d.  PPD#1.  Routine Postpartum Care: Doing well, pain well-controlled.  -- Continue routine care, lactation support  -- Contraception: Partner will be getting a Vasectomy; does not want other birth control options -- Feeding: currently breast but would like to supplement with bottle.  -- Circumcision: consented at bedside --GBS pending, no ppx abx given prior to delievry   Dispo: Plan for discharge PPD#2.  Earl Gala, Student-PA 10/21/2020 7:31 AM   I saw and evaluated the patient and repeated all components of HPI. I agree with the findings and the plan of care as documented in the PA student's note.  Casper Harrison, MD [facility] Family Medicine Fellow, [facility] for [facility], [facility]

## 2020-10-22 ENCOUNTER — Encounter (HOSPITAL_COMMUNITY): Payer: Self-pay | Admitting: Family Medicine

## 2020-10-22 ENCOUNTER — Other Ambulatory Visit (HOSPITAL_COMMUNITY): Payer: Medicaid Other

## 2020-10-22 LAB — SURGICAL PATHOLOGY

## 2020-10-22 MED ORDER — ACETAMINOPHEN FOR CIRCUMCISION 160 MG/5 ML
40.0000 mg | ORAL | Status: DC | PRN
  Filled 2020-10-22: qty 1.25

## 2020-10-22 MED ORDER — ACETAMINOPHEN FOR CIRCUMCISION 160 MG/5 ML
40.0000 mg | Freq: Once | ORAL | Status: DC
  Filled 2020-10-22: qty 1.25

## 2020-10-22 MED ORDER — EPINEPHRINE TOPICAL FOR CIRCUMCISION 0.1 MG/ML
1.0000 [drp] | TOPICAL | Status: DC | PRN
  Filled 2020-10-22: qty 1

## 2020-10-22 MED ORDER — LIDOCAINE 1% INJECTION FOR CIRCUMCISION
0.8000 mL | INJECTION | Freq: Once | INTRAVENOUS | Status: DC
  Filled 2020-10-22: qty 1

## 2020-10-22 MED ORDER — SUCROSE 24% NICU/PEDS ORAL SOLUTION: 0.5000 mL | OROMUCOSAL | Status: DC | PRN

## 2020-10-22 MED ORDER — ACETAMINOPHEN 325 MG PO TABS: 650.0000 mg | ORAL_TABLET | Freq: Four times a day (QID) | ORAL | Status: AC | PRN

## 2020-10-22 MED ORDER — COCONUT OIL OIL: 1.0000 "application " | TOPICAL_OIL | 0 refills | Status: AC | PRN

## 2020-10-22 MED ORDER — IBUPROFEN 600 MG PO TABS: 600.0000 mg | ORAL_TABLET | Freq: Four times a day (QID) | ORAL | 0 refills | Status: AC

## 2020-10-22 MED ORDER — PRENATAL MULTIVITAMIN CH: 1.0000 | ORAL_TABLET | Freq: Every day | ORAL | Status: AC

## 2020-10-22 MED ORDER — WHITE PETROLATUM EX OINT: 1.0000 "application " | TOPICAL_OINTMENT | CUTANEOUS | Status: DC | PRN

## 2020-10-23 LAB — CULTURE, BETA STREP (GROUP B ONLY): Strep Gp B Culture: NEGATIVE

## 2020-10-24 ENCOUNTER — Inpatient Hospital Stay (HOSPITAL_COMMUNITY)
Admission: AD | Admit: 2020-10-24 | Payer: Medicaid Other | Source: Home / Self Care | Admitting: Obstetrics and Gynecology

## 2020-10-24 ENCOUNTER — Inpatient Hospital Stay (HOSPITAL_COMMUNITY): Payer: Medicaid Other

## 2020-10-27 ENCOUNTER — Encounter: Payer: PRIVATE HEALTH INSURANCE | Admitting: Advanced Practice Midwife

## 2020-10-28 ENCOUNTER — Ambulatory Visit: Payer: PRIVATE HEALTH INSURANCE

## 2020-11-03 ENCOUNTER — Encounter: Payer: PRIVATE HEALTH INSURANCE | Admitting: Advanced Practice Midwife

## 2020-11-10 ENCOUNTER — Encounter: Payer: PRIVATE HEALTH INSURANCE | Admitting: Advanced Practice Midwife

## 2020-11-17 ENCOUNTER — Encounter: Payer: PRIVATE HEALTH INSURANCE | Admitting: Obstetrics and Gynecology

## 2020-11-18 ENCOUNTER — Other Ambulatory Visit (HOSPITAL_COMMUNITY)
Admission: RE | Admit: 2020-11-18 | Discharge: 2020-11-18 | Disposition: A | Discharge status: Home / Self Care | Payer: Medicaid Other | Source: Ambulatory Visit | Attending: Family Medicine | Admitting: Family Medicine

## 2020-11-18 ENCOUNTER — Encounter: Payer: Self-pay | Admitting: Family Medicine

## 2020-11-18 ENCOUNTER — Ambulatory Visit (INDEPENDENT_AMBULATORY_CARE_PROVIDER_SITE_OTHER): Payer: Medicaid Other | Admitting: Family Medicine

## 2020-11-18 ENCOUNTER — Other Ambulatory Visit: Payer: Self-pay

## 2020-11-18 DIAGNOSIS — N898 Other specified noninflammatory disorders of vagina: Secondary | ICD-10-CM | POA: Diagnosis not present

## 2020-11-18 DIAGNOSIS — B9689 Other specified bacterial agents as the cause of diseases classified elsewhere: Secondary | ICD-10-CM

## 2020-11-18 DIAGNOSIS — N760 Acute vaginitis: Secondary | ICD-10-CM

## 2020-11-18 DIAGNOSIS — O99893 Other specified diseases and conditions complicating puerperium: Secondary | ICD-10-CM

## 2020-11-18 NOTE — Progress Notes (Signed)
    Post Partum Visit Note  Kristen Phillips is a 34 y.o. [phone] female who presents for a postpartum visit. She is 4 weeks postpartum following a normal spontaneous vaginal delivery.  I have fully reviewed the prenatal and intrapartum course. The delivery was at 36 gestational weeks.  Anesthesia: none. Postpartum course has been uncomplicated. Baby is doing well. Baby is feeding by both breast and bottle - Similac Neosure. Bleeding staining only. Bowel function is normal. Bladder function is normal. Patient is not sexually active. Contraception method is none. Postpartum depression screening: negative.   The pregnancy intention screening data noted above was reviewed. Potential methods of contraception were discussed. The patient elected to proceed with Abstinence.   Some malodorous vaginal discharge recently, brown in color No abdominal pain   The following portions of the patient's history were reviewed and updated as appropriate: allergies, current medications, past family history, past medical history, past social history, past surgical history and problem list.  Review of Systems Pertinent items are noted in HPI.    Objective:  LMP 02/08/2020 (Approximate)     Physical Exam Vitals reviewed.  Constitutional:      General: She is not in acute distress.    Appearance: She is well-developed. She is not diaphoretic.  Eyes:     General: No scleral icterus. Pulmonary:     Effort: Pulmonary effort is normal. No respiratory distress.  Abdominal:     General: There is no distension.     Palpations: Abdomen is soft.     Tenderness: There is no abdominal tenderness. There is no guarding or rebound.  Skin:    General: Skin is warm and dry.  Neurological:     Mental Status: She is alert.     Coordination: Coordination normal.      Assessment:    Unremarkable postpartum exam. Pap smear not done at today's visit.   Plan:   Essential components of care per ACOG  recommendations:  1.  Mood and well being: Patient with negative depression screening today.   2. Infant care and feeding:  -Patient currently breastmilk feeding? Yes   3. Sexuality, contraception and birth spacing - Reviewed forms of contraception in tiered fashion. Patient desired abstinence today.    4. Sleep and fatigue -Encouraged family/partner/community support of 4 hrs of uninterrupted sleep to help with mood and fatigue  5. Physical Recovery  - Discussed patients delivery - Patient is safe to resume physical and sexual activity  6.  Health Maintenance - Last pap smear done per patient at [facility] last year and was normal.  7. Vaginal discharge - self swab, will call with results   Venora Maples, MD Center for [facility], [facility]

## 2020-11-18 NOTE — Progress Notes (Cosign Needed)
Pp bleeding Constipation Mood  BC- requested none at last visit... Pap/colp  Thin pink discharge Noticed after bleeding Regular bms Mood wells No BC options  Health department- a year ago, normal

## 2020-11-19 ENCOUNTER — Other Ambulatory Visit: Payer: Self-pay | Admitting: Family Medicine

## 2020-11-19 LAB — CERVICOVAGINAL ANCILLARY ONLY
Bacterial Vaginitis (gardnerella): POSITIVE — AB
Candida Glabrata: NEGATIVE
Candida Vaginitis: NEGATIVE
Chlamydia: NEGATIVE
Comment: NEGATIVE
Comment: NEGATIVE
Comment: NEGATIVE
Comment: NEGATIVE
Comment: NEGATIVE
Comment: NORMAL
Neisseria Gonorrhea: NEGATIVE
Trichomonas: NEGATIVE

## 2020-11-19 MED ORDER — METRONIDAZOLE 500 MG PO TABS: 500.0000 mg | ORAL_TABLET | Freq: Two times a day (BID) | ORAL | 0 refills | Status: AC

## 2020-11-19 NOTE — Progress Notes (Signed)
BV treatment

## 2021-08-01 IMAGING — US US OB COMP LESS 14 WK
1 series · 15 of 27 positions shown · non-contrast
Comparison: None.

CLINICAL DATA: Nausea and vomiting

EXAM:
OBSTETRIC <14 WK ULTRASOUND
TECHNIQUE: Transabdominal ultrasound was performed for evaluation of the
gestation as well as the maternal uterus and adnexal regions.

[Series 1: us ob comp less 14 wk · 15 of 27 slices shown]
[im 1/27]
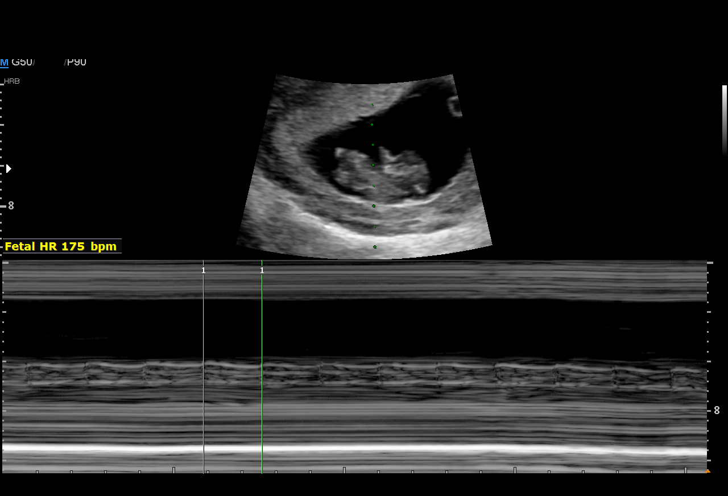
[im 3/27]
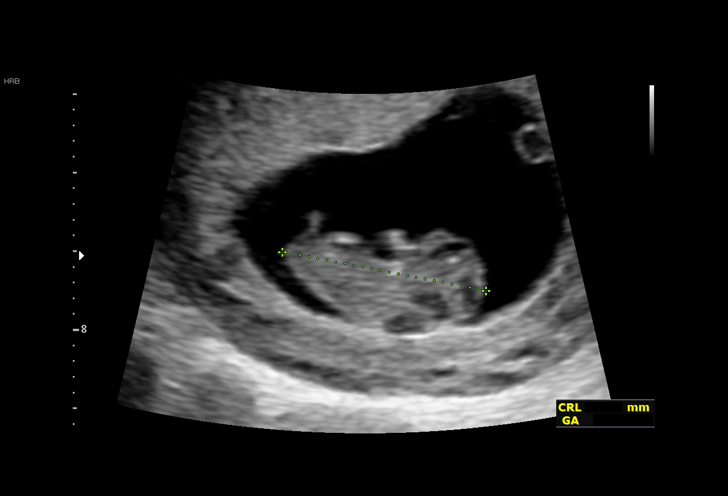
[im 5/27]
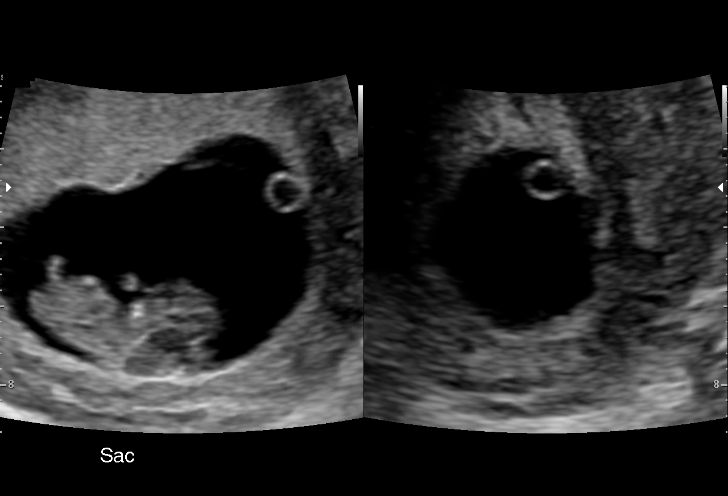
[im 7/27]
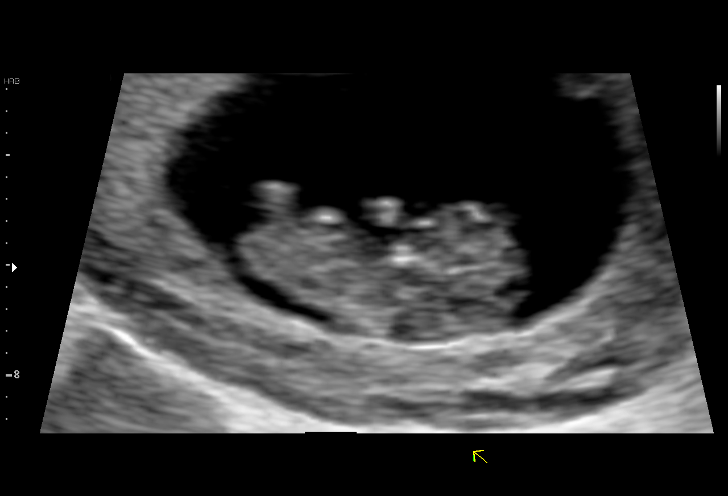
[im 9/27]
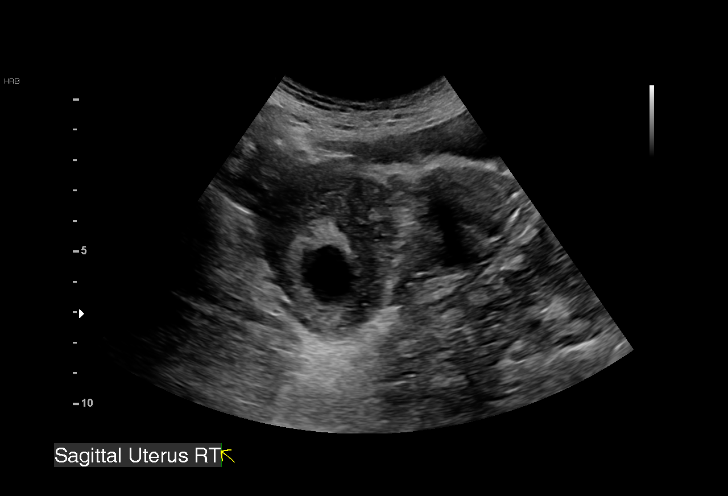
[im 10/27]
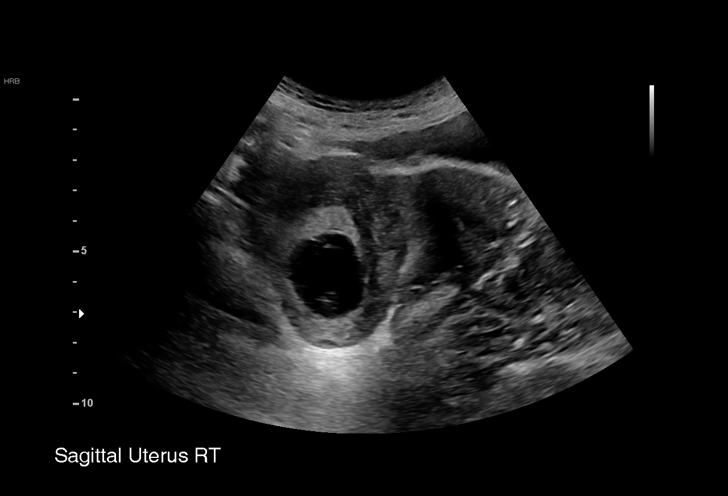
[im 12/27]
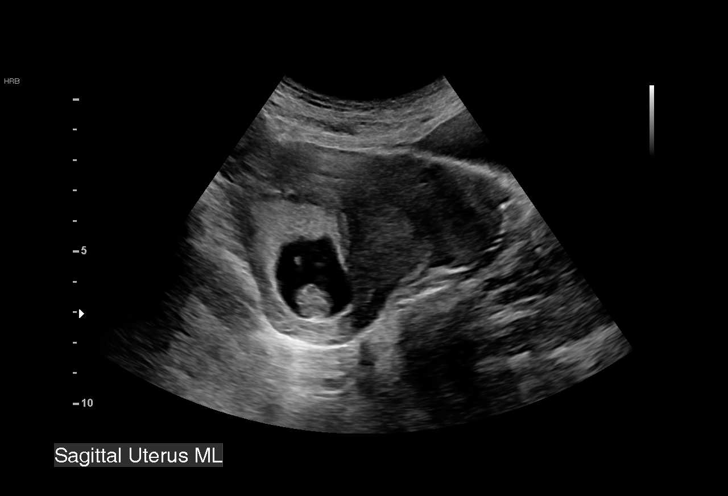
[im 14/27]
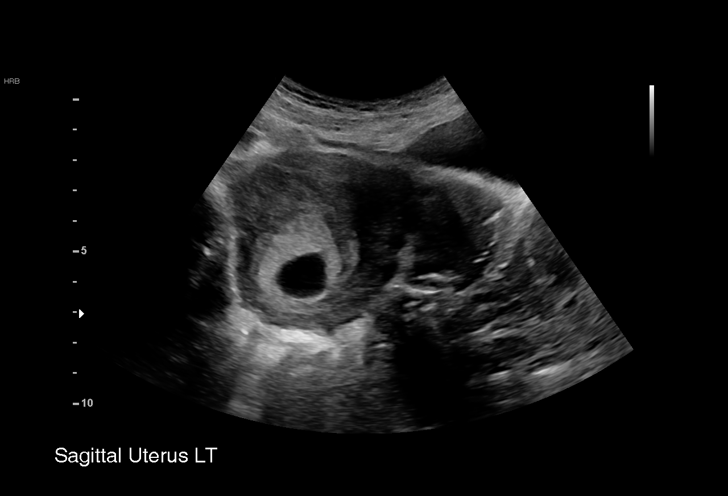
[im 16/27]
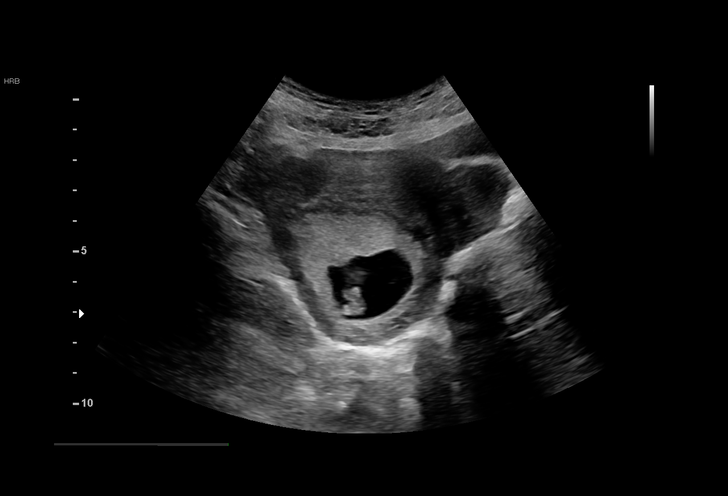
[im 18/27]
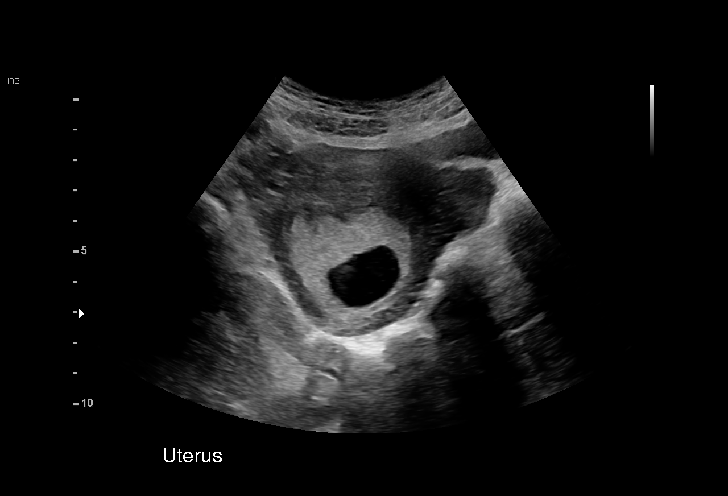
[im 19/27]
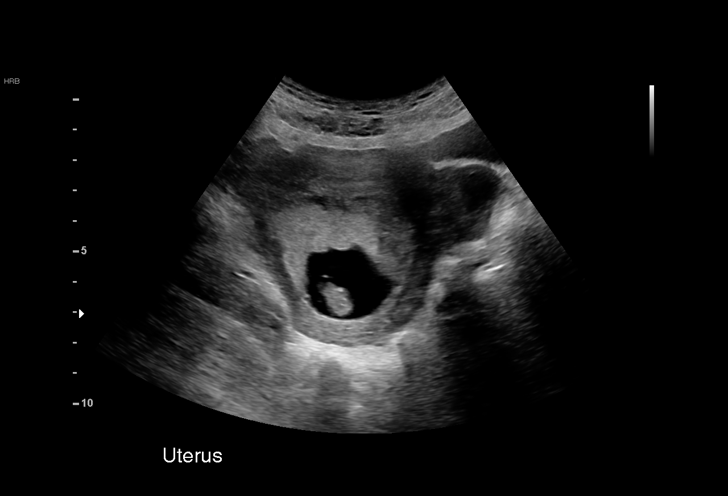
[im 21/27]
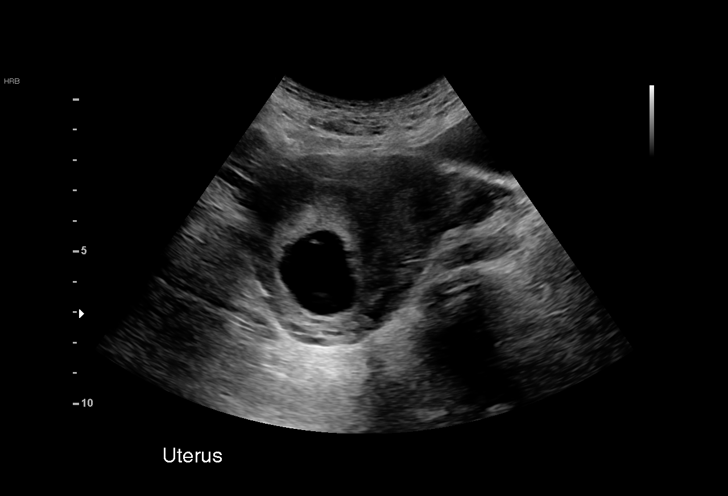
[im 23/27]
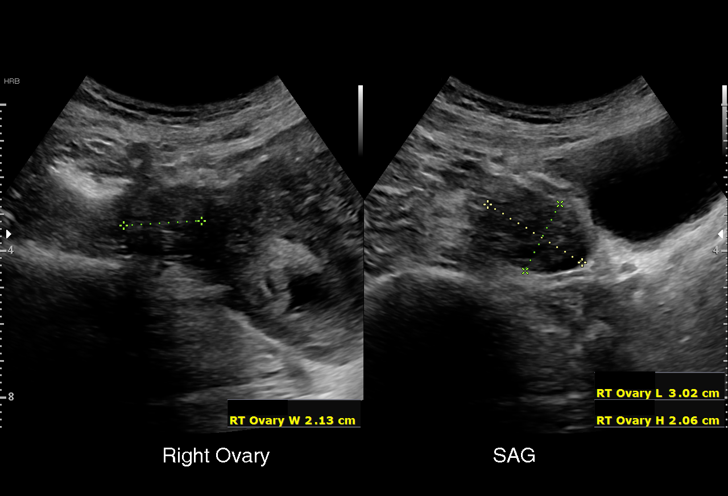
[im 25/27]
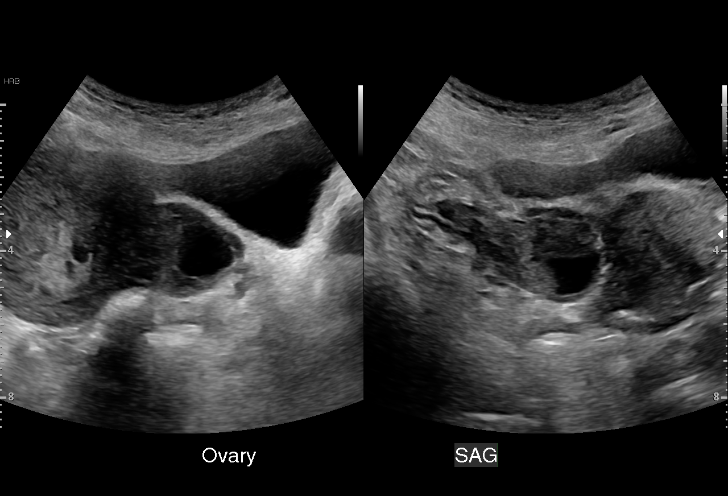
[im 27/27]
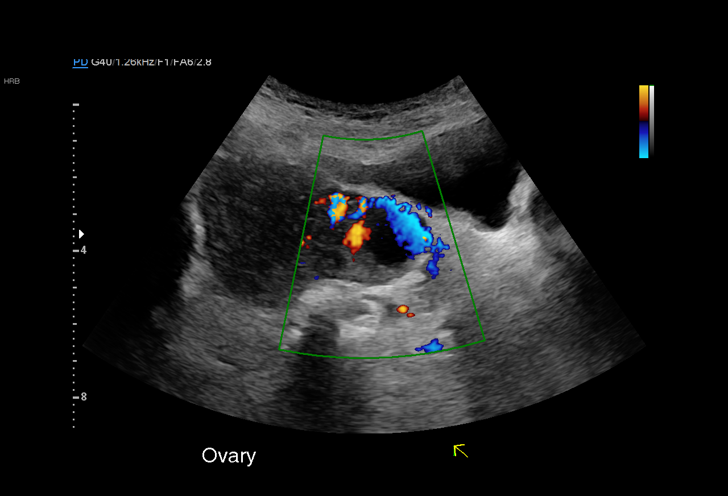

[15 of 27 positions shown; findings below may reference images not displayed]

FINDINGS: Intrauterine gestational sac: Visualized

Yolk sac:  Visualized

Embryo:  Visualized

Cardiac Activity: Visualized

Heart Rate: 175 bpm

CRL:   26 mm   9 w 2 d                  US EDC: November 14, 2020

Subchorionic hemorrhage:  None visualized.

Maternal uterus/adnexae: Cervical os is closed. Right ovary measures
3.0 x 2.1 x 2.1 cm. Left ovary measures 2.5 x 3.3 x 2.5 cm. No
extrauterine pelvic mass. No free pelvic fluid.
IMPRESSION: Single live intrauterine gestation with estimated gestational age of
9+ weeks. No subchorionic hemorrhage evident. Study otherwise
unremarkable.
